# Patient Record
Sex: Female | Born: 1945
Health system: Southern US, Community
[De-identification: ages and names within clinical notes are randomized; demographics above are authoritative.]

## PROBLEM LIST (undated history)

## (undated) DIAGNOSIS — E039 Hypothyroidism, unspecified: Secondary | ICD-10-CM

## (undated) DIAGNOSIS — R413 Other amnesia: Secondary | ICD-10-CM

## (undated) DIAGNOSIS — I10 Essential (primary) hypertension: Secondary | ICD-10-CM

## (undated) DIAGNOSIS — J309 Allergic rhinitis, unspecified: Secondary | ICD-10-CM

## (undated) HISTORY — PX: CATARACT EXTRACTION: SUR2

## (undated) HISTORY — DX: Allergic rhinitis, unspecified: J30.9

## (undated) HISTORY — PX: ECTOPIC PREGNANCY SURGERY: SHX613

## (undated) HISTORY — DX: Essential (primary) hypertension: I10

## (undated) HISTORY — DX: Hypothyroidism, unspecified: E03.9

---

## 2013-07-31 DIAGNOSIS — H6120 Impacted cerumen, unspecified ear: Secondary | ICD-10-CM | POA: Diagnosis not present

## 2013-07-31 DIAGNOSIS — H60399 Other infective otitis externa, unspecified ear: Secondary | ICD-10-CM | POA: Diagnosis not present

## 2013-07-31 DIAGNOSIS — J309 Allergic rhinitis, unspecified: Secondary | ICD-10-CM | POA: Diagnosis not present

## 2013-09-18 DIAGNOSIS — L659 Nonscarring hair loss, unspecified: Secondary | ICD-10-CM | POA: Diagnosis not present

## 2013-09-18 DIAGNOSIS — E039 Hypothyroidism, unspecified: Secondary | ICD-10-CM | POA: Diagnosis not present

## 2013-11-05 DIAGNOSIS — E039 Hypothyroidism, unspecified: Secondary | ICD-10-CM | POA: Diagnosis not present

## 2014-02-11 DIAGNOSIS — E039 Hypothyroidism, unspecified: Secondary | ICD-10-CM | POA: Diagnosis not present

## 2014-08-15 DIAGNOSIS — B029 Zoster without complications: Secondary | ICD-10-CM | POA: Diagnosis not present

## 2014-08-15 DIAGNOSIS — E039 Hypothyroidism, unspecified: Secondary | ICD-10-CM | POA: Diagnosis not present

## 2015-09-01 DIAGNOSIS — L659 Nonscarring hair loss, unspecified: Secondary | ICD-10-CM | POA: Diagnosis not present

## 2015-09-01 DIAGNOSIS — M67431 Ganglion, right wrist: Secondary | ICD-10-CM | POA: Diagnosis not present

## 2015-09-01 DIAGNOSIS — D229 Melanocytic nevi, unspecified: Secondary | ICD-10-CM | POA: Diagnosis not present

## 2015-09-01 DIAGNOSIS — E039 Hypothyroidism, unspecified: Secondary | ICD-10-CM | POA: Diagnosis not present

## 2015-09-10 DIAGNOSIS — X32XXXA Exposure to sunlight, initial encounter: Secondary | ICD-10-CM | POA: Diagnosis not present

## 2015-09-10 DIAGNOSIS — L820 Inflamed seborrheic keratosis: Secondary | ICD-10-CM | POA: Diagnosis not present

## 2015-09-10 DIAGNOSIS — L570 Actinic keratosis: Secondary | ICD-10-CM | POA: Diagnosis not present

## 2016-09-08 DIAGNOSIS — E559 Vitamin D deficiency, unspecified: Secondary | ICD-10-CM | POA: Diagnosis not present

## 2016-09-08 DIAGNOSIS — Z0000 Encounter for general adult medical examination without abnormal findings: Secondary | ICD-10-CM | POA: Diagnosis not present

## 2016-09-08 DIAGNOSIS — Z136 Encounter for screening for cardiovascular disorders: Secondary | ICD-10-CM | POA: Diagnosis not present

## 2016-09-08 DIAGNOSIS — E039 Hypothyroidism, unspecified: Secondary | ICD-10-CM | POA: Diagnosis not present

## 2016-09-08 DIAGNOSIS — Z131 Encounter for screening for diabetes mellitus: Secondary | ICD-10-CM | POA: Diagnosis not present

## 2017-01-19 DIAGNOSIS — L820 Inflamed seborrheic keratosis: Secondary | ICD-10-CM | POA: Diagnosis not present

## 2017-11-22 DIAGNOSIS — Z1211 Encounter for screening for malignant neoplasm of colon: Secondary | ICD-10-CM | POA: Diagnosis not present

## 2017-11-22 DIAGNOSIS — R030 Elevated blood-pressure reading, without diagnosis of hypertension: Secondary | ICD-10-CM | POA: Diagnosis not present

## 2017-11-22 DIAGNOSIS — E559 Vitamin D deficiency, unspecified: Secondary | ICD-10-CM | POA: Diagnosis not present

## 2017-11-22 DIAGNOSIS — E039 Hypothyroidism, unspecified: Secondary | ICD-10-CM | POA: Diagnosis not present

## 2018-04-22 DIAGNOSIS — H6123 Impacted cerumen, bilateral: Secondary | ICD-10-CM | POA: Diagnosis not present

## 2018-04-25 DIAGNOSIS — H6122 Impacted cerumen, left ear: Secondary | ICD-10-CM | POA: Diagnosis not present

## 2018-12-07 DIAGNOSIS — E663 Overweight: Secondary | ICD-10-CM | POA: Diagnosis not present

## 2018-12-07 DIAGNOSIS — Z6826 Body mass index (BMI) 26.0-26.9, adult: Secondary | ICD-10-CM | POA: Diagnosis not present

## 2018-12-07 DIAGNOSIS — Z1159 Encounter for screening for other viral diseases: Secondary | ICD-10-CM | POA: Diagnosis not present

## 2018-12-07 DIAGNOSIS — E039 Hypothyroidism, unspecified: Secondary | ICD-10-CM | POA: Diagnosis not present

## 2018-12-07 DIAGNOSIS — E559 Vitamin D deficiency, unspecified: Secondary | ICD-10-CM | POA: Diagnosis not present

## 2019-06-08 DIAGNOSIS — H8110 Benign paroxysmal vertigo, unspecified ear: Secondary | ICD-10-CM | POA: Diagnosis not present

## 2019-08-28 DIAGNOSIS — Z23 Encounter for immunization: Secondary | ICD-10-CM | POA: Diagnosis not present

## 2019-11-29 ENCOUNTER — Ambulatory Visit: Payer: Medicare Other | Attending: Internal Medicine

## 2019-11-29 DIAGNOSIS — Z20822 Contact with and (suspected) exposure to covid-19: Secondary | ICD-10-CM

## 2019-11-30 LAB — NOVEL CORONAVIRUS, NAA: SARS-CoV-2, NAA: NOT DETECTED

## 2019-12-07 ENCOUNTER — Encounter (HOSPITAL_COMMUNITY): Payer: Self-pay | Admitting: Emergency Medicine

## 2019-12-07 ENCOUNTER — Other Ambulatory Visit: Payer: Self-pay

## 2019-12-07 ENCOUNTER — Emergency Department (HOSPITAL_COMMUNITY)
Admission: EM | Admit: 2019-12-07 | Discharge: 2019-12-07 | Disposition: A | Discharge status: Home / Self Care | Payer: Medicare Other | Attending: Emergency Medicine | Admitting: Emergency Medicine

## 2019-12-07 DIAGNOSIS — E039 Hypothyroidism, unspecified: Secondary | ICD-10-CM | POA: Insufficient documentation

## 2019-12-07 DIAGNOSIS — Z79899 Other long term (current) drug therapy: Secondary | ICD-10-CM | POA: Insufficient documentation

## 2019-12-07 DIAGNOSIS — Z2914 Encounter for prophylactic rabies immune globin: Secondary | ICD-10-CM | POA: Insufficient documentation

## 2019-12-07 DIAGNOSIS — Z203 Contact with and (suspected) exposure to rabies: Secondary | ICD-10-CM | POA: Diagnosis not present

## 2019-12-07 DIAGNOSIS — Z23 Encounter for immunization: Secondary | ICD-10-CM | POA: Diagnosis not present

## 2019-12-07 DIAGNOSIS — Z209 Contact with and (suspected) exposure to unspecified communicable disease: Secondary | ICD-10-CM

## 2019-12-07 DIAGNOSIS — I10 Essential (primary) hypertension: Secondary | ICD-10-CM | POA: Insufficient documentation

## 2019-12-07 MED ORDER — RABIES IMMUNE GLOBULIN 150 UNIT/ML IM INJ
20.0000 [IU]/kg | INJECTION | Freq: Once | INTRAMUSCULAR | Status: AC
  Administered 2019-12-07: 1200 [IU] via INTRAMUSCULAR
  Filled 2019-12-07: qty 8

## 2019-12-07 MED ORDER — RABIES VACCINE, PCEC IM SUSR
1.0000 mL | Freq: Once | INTRAMUSCULAR | Status: AC
  Administered 2019-12-07: 1 mL via INTRAMUSCULAR
  Filled 2019-12-07: qty 1

## 2019-12-07 NOTE — ED Provider Notes (Signed)
[facility] EMERGENCY DEPARTMENT Provider Note   CSN: [id number] Arrival date & time: 12/07/19  1214     History No chief complaint on file.   Valerie Reese is a 74 y.o. female presents to the ER for evaluation of bat exposure.  Patient states she returned home from a walk this morning and her dog started barking at the hat rack she had inside her house.  Patient was moving the hats from the hat rack to see what the dog was barking and a bat fell out of the hat onto the floor.  Patient states that the bat did not fly away or move but only called slightly, was crawling.  Patient used a wire basket to capture the bat and then took it outside.  She called her PCP who advised her to get the rabies vaccine due to possible exposure and unknown time the bat was in her home.  Patient denies coming directly in contact with the animal.  She denies any known bite marks or scratches.  Has never received rabies vaccine before.  No other concerns today.  HPI     Past Medical History:  Diagnosis Date  . Hypothyroid    age 61    There are no problems to display for this patient.   History reviewed. No pertinent surgical history.   OB History   No obstetric history on file.     Family History  Problem Relation Age of Onset  . Aneurysm Mother   . Fibroids Father   . Pulmonary disease Father   . Hypertension Father   . Hypertension Brother     Social History   Tobacco Use  . Smoking status: Former Research scientist (life sciences)  . Smokeless tobacco: Never Used  Substance Use Topics  . Alcohol use: Not on file  . Drug use: Not on file    Home Medications Prior to Admission medications   Medication Sig Start Date End Date Taking? Authorizing Provider  Cholecalciferol (VITAMIN D) 125 MCG (5000 UT) CAPS Take by mouth.    [provider]  Coenzyme Q10 (CO Q 10 PO) Take by mouth.    [provider]  levothyroxine (SYNTHROID) 112 MCG tablet Take 112 mcg by mouth daily  before breakfast.    [provider]  meclizine (ANTIVERT) 25 MG tablet Take 25 mg by mouth 3 (three) times daily as needed for dizziness.    [provider]  Multiple Vitamins-Minerals (EMERGEN-C IMMUNE PO) Take by mouth.    [provider]  TURMERIC PO Take by mouth.    [provider]    Allergies    Patient has no known allergies.  Review of Systems   Review of Systems  All other systems reviewed and are negative.   Physical Exam Updated Vital Signs BP (!) 189/109 (BP Location: Right Arm)   Pulse (!) 103   Temp 98.2 F (36.8 C) (Oral)   Resp 17   Wt 59.9 kg Comment: patient reported  SpO2 99%   Physical Exam Constitutional:      Appearance: She is well-developed.  HENT:     Head: Normocephalic.     Nose: Nose normal.  Eyes:     General: Lids are normal.  Cardiovascular:     Rate and Rhythm: Normal rate.  Pulmonary:     Effort: Pulmonary effort is normal. No respiratory distress.  Musculoskeletal:        General: Normal range of motion.     Cervical  back: Normal range of motion.  Neurological:     Mental Status: She is alert.  Psychiatric:        Behavior: Behavior normal.     ED Results / Procedures / Treatments   Labs (all labs ordered are listed, but only abnormal results are displayed) Labs Reviewed - No data to display  EKG None  Radiology No results found.  Procedures Procedures (including critical care time)  Medications Ordered in ED Medications  rabies vaccine (RABAVERT) injection 1 mL (1 mL Intramuscular Given 12/07/19 1350)  rabies immune globulin (HYPERAB/KEDRAB) injection 1,200 Units (1,200 Units Intramuscular Given 12/07/19 1352)    ED Course  I have reviewed the triage vital signs and the nursing notes.  Pertinent labs & imaging results that were available during my care of the patient were reviewed by me and considered in my medical decision making (see chart for details).    MDM  Rules/Calculators/A&P                      Discussed risk of rabies, risks and benefits of rabies vaccine.  Is unclear how long the bat has been in her house.  However she denies any direct known contact or bite marks.  Low threshold to start rabies vaccine.  Patient is agreeable.  Rabies immunoglobulin and vaccine given here.  She was educated on subsequent needed rabies vaccines.  She has her COVID-19 vaccine scheduled on 2/8.  Per CDC recommendations she is to wait 14 days after the last rabies vaccine.  Pharmacy discussed this with patient.  Instructed to follow-up as needed for subsequent rabies vaccines.  Return precautions discussed.  She is comfortable with the plan.   Final Clinical Impression(s) / ED Diagnoses Final diagnoses:  Exposure to bat without known bite    Rx / DC Orders ED Discharge Orders    None       Arlean Hopping 12/07/19 1553    Dorie Rank, MD 12/08/19 [id number]

## 2019-12-07 NOTE — ED Notes (Signed)
Patient states she never touched the bat however did touch the hats on the hatrack and the bowl she took the bat out of the house in.

## 2019-12-07 NOTE — Discharge Instructions (Addendum)
You were seen in the ER for bat exposure.  Rabies immunoglobulin and rabies vaccine were both administered today.  Today counts is day 0.  You need 3 more rabies vaccines on day 3, 7 and 14.  Day 0, 12/06/2018: rabies immune globulin (RIG) and rabies vaccine administered  Day 3, 12/09/2018: rabies vaccine needed Day 7, 12/13/2018: rabies vaccine needed Day 14, 12/20/2018: rabies vaccine needed   CDC recommends a 14 day wait period after last dose of rabies vaccine before you get your COVID vaccine

## 2019-12-07 NOTE — ED Triage Notes (Signed)
Pt here from home ,told to come to the ED for a rabies shot due to having a bat in her home , she did not touch the animal

## 2019-12-10 ENCOUNTER — Encounter (HOSPITAL_COMMUNITY): Payer: Self-pay

## 2019-12-10 ENCOUNTER — Ambulatory Visit (HOSPITAL_COMMUNITY)
Admission: EM | Admit: 2019-12-10 | Discharge: 2019-12-10 | Disposition: A | Discharge status: Home / Self Care | Payer: Medicare Other | Attending: Internal Medicine | Admitting: Internal Medicine

## 2019-12-10 DIAGNOSIS — Z23 Encounter for immunization: Secondary | ICD-10-CM

## 2019-12-10 DIAGNOSIS — Z203 Contact with and (suspected) exposure to rabies: Secondary | ICD-10-CM | POA: Diagnosis not present

## 2019-12-10 MED ORDER — RABIES VACCINE, PCEC IM SUSR
1.0000 mL | Freq: Once | INTRAMUSCULAR | Status: AC
  Administered 2019-12-10: 10:00:00 1 mL via INTRAMUSCULAR

## 2019-12-10 MED ORDER — RABIES VACCINE, PCEC IM SUSR
INTRAMUSCULAR | Status: AC
  Filled 2019-12-10: qty 1

## 2019-12-10 NOTE — ED Triage Notes (Signed)
Pt here for Day 3 of rabies vaccine series. Given in right deltoid.

## 2019-12-14 ENCOUNTER — Ambulatory Visit (HOSPITAL_COMMUNITY)
Admission: EM | Admit: 2019-12-14 | Discharge: 2019-12-14 | Disposition: A | Discharge status: Home / Self Care | Payer: Medicare Other | Attending: Emergency Medicine | Admitting: Emergency Medicine

## 2019-12-14 ENCOUNTER — Other Ambulatory Visit: Payer: Self-pay

## 2019-12-14 ENCOUNTER — Encounter (HOSPITAL_COMMUNITY): Payer: Self-pay

## 2019-12-14 DIAGNOSIS — Z203 Contact with and (suspected) exposure to rabies: Secondary | ICD-10-CM

## 2019-12-14 MED ORDER — RABIES VACCINE, PCEC IM SUSR
1.0000 mL | Freq: Once | INTRAMUSCULAR | Status: AC
  Administered 2019-12-14: 09:00:00 1 mL via INTRAMUSCULAR

## 2019-12-14 MED ORDER — RABIES VACCINE, PCEC IM SUSR
INTRAMUSCULAR | Status: AC
  Filled 2019-12-14: qty 1

## 2019-12-14 NOTE — ED Triage Notes (Signed)
Pt here for day 7 of rabies series, given in left deltoid.

## 2019-12-17 ENCOUNTER — Encounter: Payer: Self-pay | Admitting: Internal Medicine

## 2019-12-17 ENCOUNTER — Other Ambulatory Visit: Payer: Self-pay

## 2019-12-17 ENCOUNTER — Encounter: Payer: Self-pay | Admitting: *Deleted

## 2019-12-17 ENCOUNTER — Ambulatory Visit: Payer: Medicare Other | Admitting: Internal Medicine

## 2019-12-17 VITALS — BP 182/95 | HR 69 | Temp 97.2°F | Ht 62.0 in | Wt 140.4 lb

## 2019-12-17 DIAGNOSIS — E039 Hypothyroidism, unspecified: Secondary | ICD-10-CM | POA: Diagnosis not present

## 2019-12-17 DIAGNOSIS — R030 Elevated blood-pressure reading, without diagnosis of hypertension: Secondary | ICD-10-CM | POA: Diagnosis not present

## 2019-12-17 DIAGNOSIS — R002 Palpitations: Secondary | ICD-10-CM

## 2019-12-17 NOTE — Patient Instructions (Signed)
Medication Instructions:  No changes *If you need a refill on your cardiac medications before your next appointment, please call your pharmacy*  Lab Work: Not needed   Testing/Procedures: Will be schedule at [facility] has requested that you have an echocardiogram. Echocardiography is a painless test that uses sound waves to create images of your heart. It provides your doctor with information about the size and shape of your heart and how well your heart's chambers and valves are working. This procedure takes approximately one hour. There are no restrictions for this procedure.  And Your physician has recommended that you wear an event monitor 2 weeks . Event monitors are medical devices that record the heart's electrical activity. Doctors most often [place] these monitors to diagnose arrhythmias. Arrhythmias are problems with the speed or rhythm of the heartbeat. The monitor is a small, portable device. You can wear one while you do your normal daily activities. This is usually used to diagnose what is causing palpitations/syncope (passing out).   Follow-Up: At [facility], you and your health needs are our priority.  As part of our continuing mission to provide you with exceptional heart care, we have created designated Provider Care Teams.  These Care Teams include your primary Cardiologist (physician) and Advanced Practice Providers (APPs -  Physician Assistants and Nurse Practitioners) who all work together to provide you with the care you need, when you need it.  Your next appointment:   6 week(s)  The format for your next appointment:   In Person  Provider:   Cherlynn Kaiser, MD  Other Instructions N/a   [employer] Cardiac Event Monitor Instructions Your physician has requested you wear your cardiac event monitor for __14___ days, (1-30). [employer] may call or text to confirm a shipping address. The monitor will be sent to a land address  via UPS. [employer] will not ship a monitor to a PO BOX. It typically takes 3-5 days to receive your monitor after it has been enrolled. [employer] will assist with USPS tracking if your package is delayed. The telephone number for [employer] is [phone]. Once you have received your monitor, please review the enclosed instructions. Instruction tutorials can also be viewed under help and settings on the enclosed cell phone. Your monitor has already been registered assigning a specific monitor serial # to you.  Applying the monitor Remove cell phone from case and turn it on. The cell phone works as Dealer and needs to be within [employer] of you at all times. The cell phone will need to be charged on a daily basis. We recommend you plug the cell phone into the enclosed charger at your bedside table every night.  Monitor batteries: You will receive two monitor batteries labelled #1 and #2. These are your recorders. Plug battery #2 onto the second connection on the enclosed charger. Keep one battery on the charger at all times. This will keep the monitor battery deactivated. It will also keep it fully charged for when you need to switch your monitor batteries. A small light will be blinking on the battery emblem when it is charging. The light on the battery emblem will remain on when the battery is fully charged.  Open package of a Monitor strip. Insert battery #1 into black hood on strip and gently squeeze monitor battery onto connection as indicated in instruction booklet. Set aside while preparing skin.  Choose location for your strip, vertical or horizontal, as indicated in the instruction  booklet. Shave to remove all hair from location. There cannot be any lotions, oils, powders, or colognes on skin where monitor is to be applied. Wipe skin clean with enclosed Saline wipe. Dry skin completely.  Peel paper labeled #1 off the back of the Monitor strip exposing the adhesive.  Place the monitor on the chest in the vertical or horizontal position shown in the instruction booklet. One arrow on the monitor strip must be pointing upward. Carefully remove paper labeled #2, attaching remainder of strip to your skin. Try not to create any folds or wrinkles in the strip as you apply it.  Firmly press and release the circle in the center of the monitor battery. You will hear a small beep. This is turning the monitor battery on. The heart emblem on the monitor battery will light up every 5 seconds if the monitor battery in turned on and connected to the patient securely. Do not push and hold the circle down as this turns the monitor battery off. The cell phone will locate the monitor battery. A screen will appear on the cell phone checking the connection of your monitor strip. This may read poor connection initially but change to good connection within the next minute. Once your monitor accepts the connection you will hear a series of 3 beeps followed by a climbing crescendo of beeps. A screen will appear on the cell phone showing the two monitor strip placement options. Touch the picture that demonstrates where you applied the monitor strip.  Your monitor strip and battery are waterproof. You are able to shower, bathe, or swim with the monitor on. They just ask you do not submerge deeper than 3 feet underwater. We recommend removing the monitor if you are swimming in a lake, river, or ocean.  Your monitor battery will need to be switched to a fully charged monitor battery approximately once a week. The cell phone will alert you of an action which needs to be made.  On the cell phone, tap for details to reveal connection status, monitor battery status, and cell phone battery status. The green dots indicates your monitor is in good status. A red dot indicates there is something that needs your attention.  To record a symptom, click the circle on the monitor battery. In 30-60  seconds a list of symptoms will appear on the cell phone. Select your symptom and tap save. Your monitor will record a sustained or significant arrhythmia regardless of you clicking the button. Some patients do not feel the heart rhythm irregularities. [employer] will notify us of any serious or critical events.  Refer to instruction booklet for instructions on switching batteries, changing strips, the Do not disturb or Pause features, or any additional questions.  Call [employer] at [phone], to confirm your monitor is transmitting and record your baseline. They will answer any questions you may have regarding the monitor instructions at that time.  Returning the monitor to [place] all equipment back into blue box. Peel off strip of paper to expose adhesive and close box securely. There is a prepaid UPS shipping label on this box. Drop in a UPS drop box, or at a UPS facility like [employer]. You may also contact [employer] to arrange UPS to pick up monitor package at your home.

## 2019-12-17 NOTE — Progress Notes (Signed)
Patient ID: Valerie Reese, female   DOB: 07/07/1946, 74 y.o.   MRN: [id number] Patient enrolled for [employer] to ship a 14 day cardiac event monitor to her home.

## 2019-12-17 NOTE — Progress Notes (Signed)
Cardiology Office Note:    Date:  12/17/2019   ID:  Valerie Reese, DOB 1946-07-07, MRN [id number]  PCP:  Kathyrn Lass, MD  Cardiologist:  No primary care provider on file.  Electrophysiologist:  None   Referring MD: Kathyrn Lass, MD   Chief Complaint: palpitations  History of Present Illness:    Valerie Reese is a 74 y.o. female with a hx of hypothyroidism who presents today for palpitations, with concern to rule out atrial fibrillation from PCP.   She is recently retired and feeling stressed given that she is unable to see or talk to anyone given the COVID-19 pandemic.  She noticed that approximately 2 times a day for the past several weeks she has had noticeable heartbeats that seem fast and regular, in early December she transition to decaf coffee.  Does not take decongestants.  No chest pain, no shortness of breath.  Episodes last 5 to 10 minutes and then stop.  Patient takes levothyroxine for thyroid supplementation.  Retired recently. Anxious because of isolation. Heart beating fast, no pain. Off and on. Yesterday bothered her due to anxiety. Happens 1x a week, a few times a month.   Parents no heart disease, uncle died in 60s of MI.   Prior smoker quit in her 31s, smoked for 15 years, 1ppd.   Recent labs TSH 0.77 (normal), creatinine 0.74, potassium 5.1, sodium 137, hemoglobin 13.3, platelet count 305,000, WBCs 6.6.  Caffeine: Now using decaf Alcohol: several times a week beer Water intake: dehydrated Snoring: no TSH: Normal in December Herbal supplements/diet products: Turmeric - through [employer].  Syncope/presyncope: no, but dizziness  Past Medical History:  Diagnosis Date  . Hypothyroid    age 5    No past surgical history on file.  Current Medications: Current Meds  Medication Sig  . Cholecalciferol (VITAMIN D) 125 MCG (5000 UT) CAPS Take by mouth.  . Coenzyme Q10 (CO Q 10 PO) Take by mouth.  . levothyroxine (SYNTHROID) 112 MCG tablet Take 112 mcg by  mouth daily before breakfast.  . Multiple Vitamins-Minerals (EMERGEN-C IMMUNE PO) Take by mouth.  . TURMERIC PO Take by mouth.     Allergies:   Patient has no known allergies.   Social History   Socioeconomic History  . Marital status: Single    Spouse name: Not on file  . Number of children: Not on file  . Years of education: Not on file  . Highest education level: Not on file  Occupational History  . Not on file  Tobacco Use  . Smoking status: Former Research scientist (life sciences)  . Smokeless tobacco: Never Used  Substance and Sexual Activity  . Alcohol use: Not on file  . Drug use: Not on file  . Sexual activity: Not on file  Other Topics Concern  . Not on file  Social History Narrative  . Not on file   Social Determinants of Health   Financial Resource Strain:   . Difficulty of Paying Living Expenses: Not on file  Food Insecurity:   . Worried About Charity fundraiser in the Last Year: Not on file  . Ran Out of Food in the Last Year: Not on file  Transportation Needs:   . Lack of Transportation (Medical): Not on file  . Lack of Transportation (Non-Medical): Not on file  Physical Activity:   . Days of Exercise per Week: Not on file  . Minutes of Exercise per Session: Not on file  Stress:   . Feeling of Stress :  Not on file  Social Connections:   . Frequency of Communication with Friends and Family: Not on file  . Frequency of Social Gatherings with Friends and Family: Not on file  . Attends Religious Services: Not on file  . Active Member of Clubs or Organizations: Not on file  . Attends Archivist Meetings: Not on file  . Marital Status: Not on file     Family History: The patient's family history includes Aneurysm in her mother; Fibroids in her father; Hypertension in her brother and father; Pulmonary disease in her father.  ROS:   Please see the history of present illness.    All other systems reviewed and are negative.  EKGs/Labs/Other Studies Reviewed:    The  following studies were reviewed today:  EKG:  NSR,sinus arrhythmia, rate 69.  Recent Labs: No results found for requested labs within last 8760 hours.  Recent Lipid Panel No results found for: CHOL, TRIG, HDL, CHOLHDL, VLDL, LDLCALC, LDLDIRECT  Physical Exam:    VS:  BP (!) 182/95   Pulse 69   Temp (!) 97.2 F (36.2 C)   Ht 5\' 2"  (1.575 m)   Wt 140 lb 6.4 oz (63.7 kg)   SpO2 100%   BMI 25.68 kg/m   180/98, at home 130s/70s  Wt Readings from Last 5 Encounters:  12/17/19 140 lb 6.4 oz (63.7 kg)  12/07/19 132 lb (59.9 kg)     Constitutional: No acute distress Eyes: sclera non-icteric, normal conjunctiva and lids ENMT: normal dentition, moist mucous membranes Cardiovascular: regular rhythm, normal rate, no murmurs. S1 and S2 normal. Radial pulses normal bilaterally. No jugular venous distention.  Respiratory: clear to auscultation bilaterally GI : normal bowel sounds, soft and nontender. No distention.   MSK: extremities warm, well perfused. No edema.  NEURO: grossly nonfocal exam, moves all extremities. PSYCH: alert and oriented x 3, normal mood and affect.   ASSESSMENT:    1. Palpitations   2. Elevated blood pressure reading   3. Hypothyroidism, unspecified type    PLAN:    Palpitations -she will need an evaluation for palpitations including an event monitor to exclude atrial fibrillation.  In addition we should obtain an echocardiogram to rule out structural heart disease as a contributor.  Elevated blood pressure reading-in office and ER blood pressures significantly elevated, however she tells me that her blood pressures are usually 130s over 70s at home.  We will monitor this again in the office next week, if still elevated, indications to treat whitecoat hypertension given its concern for underlying hypertensive disorder.  Hypothyroidism-per PCP, last TSH normal, hopeful that it is not a contributor to palpitations.  Dizziness-1 episode, prescribed meclizine which  she has not taken.  Not in conjunction with palpitations.  Continue to observe.  Total time of encounter: 45 minutes total time of encounter, including 25 minutes spent in face-to-face patient care. This time includes coordination of care and counseling regarding above mentioned issues. Remainder of non-face-to-face time involved reviewing chart documents/testing relevant to the patient encounter and documentation in the medical record. Approximately 20 pages of outside records reviewed in conjunction with this encounter.   Cherlynn Kaiser, MD [place]  [facility]   Medication Adjustments/Labs and Tests Ordered: Current medicines are reviewed at length with the patient today.  Concerns regarding medicines are outlined above.  Orders Placed This Encounter  Procedures  . CARDIAC EVENT MONITOR  . EKG 12-Lead  . ECHOCARDIOGRAM COMPLETE   No orders of the defined types  were placed in this encounter.   Patient Instructions  Medication Instructions:  No changes *If you need a refill on your cardiac medications before your next appointment, please call your pharmacy*  Lab Work: Not needed   Testing/Procedures: Will be schedule at [place] has requested that you have an echocardiogram. Echocardiography is a painless test that uses sound waves to create images of your heart. It provides your doctor with information about the size and shape of your heart and how well your heart's chambers and valves are working. This procedure takes approximately one hour. There are no restrictions for this procedure.  And Your physician has recommended that you wear an event monitor 2 weeks . Event monitors are medical devices that record the heart's electrical activity. Doctors most often [place] these monitors to diagnose arrhythmias. Arrhythmias are problems with the speed or rhythm of the heartbeat. The monitor is a small, portable device. You can wear one while  you do your normal daily activities. This is usually used to diagnose what is causing palpitations/syncope (passing out).   Follow-Up: At [facility], you and your health needs are our priority.  As part of our continuing mission to provide you with exceptional heart care, we have created designated Provider Care Teams.  These Care Teams include your primary Cardiologist (physician) and Advanced Practice Providers (APPs -  Physician Assistants and Nurse Practitioners) who all work together to provide you with the care you need, when you need it.  Your next appointment:   6 week(s)  The format for your next appointment:   In Person  Provider:   Cherlynn Kaiser, MD  Other Instructions N/a   [employer] Cardiac Event Monitor Instructions Your physician has requested you wear your cardiac event monitor for __14___ days, (1-30). [employer] may call or text to confirm a shipping address. The monitor will be sent to a land address via UPS. [employer] will not ship a monitor to a PO BOX. It typically takes 3-5 days to receive your monitor after it has been enrolled. [employer] will assist with USPS tracking if your package is delayed. The telephone number for [employer] is [phone]. Once you have received your monitor, please review the enclosed instructions. Instruction tutorials can also be viewed under help and settings on the enclosed cell phone. Your monitor has already been registered assigning a specific monitor serial # to you.  Applying the monitor Remove cell phone from case and turn it on. The cell phone works as Dealer and needs to be within [employer] of you at all times. The cell phone will need to be charged on a daily basis. We recommend you plug the cell phone into the enclosed charger at your bedside table every night.  Monitor batteries: You will receive two monitor batteries labelled #1 and #2. These are your recorders. Plug battery #2 onto the second  connection on the enclosed charger. Keep one battery on the charger at all times. This will keep the monitor battery deactivated. It will also keep it fully charged for when you need to switch your monitor batteries. A small light will be blinking on the battery emblem when it is charging. The light on the battery emblem will remain on when the battery is fully charged.  Open package of a Monitor strip. Insert battery #1 into black hood on strip and gently squeeze monitor battery onto connection as indicated in instruction booklet. Set aside while preparing skin.  Choose location  for your strip, vertical or horizontal, as indicated in the instruction booklet. Shave to remove all hair from location. There cannot be any lotions, oils, powders, or colognes on skin where monitor is to be applied. Wipe skin clean with enclosed Saline wipe. Dry skin completely.  Peel paper labeled #1 off the back of the Monitor strip exposing the adhesive. Place the monitor on the chest in the vertical or horizontal position shown in the instruction booklet. One arrow on the monitor strip must be pointing upward. Carefully remove paper labeled #2, attaching remainder of strip to your skin. Try not to create any folds or wrinkles in the strip as you apply it.  Firmly press and release the circle in the center of the monitor battery. You will hear a small beep. This is turning the monitor battery on. The heart emblem on the monitor battery will light up every 5 seconds if the monitor battery in turned on and connected to the patient securely. Do not push and hold the circle down as this turns the monitor battery off. The cell phone will locate the monitor battery. A screen will appear on the cell phone checking the connection of your monitor strip. This may read poor connection initially but change to good connection within the next minute. Once your monitor accepts the connection you will hear a series of 3 beeps  followed by a climbing crescendo of beeps. A screen will appear on the cell phone showing the two monitor strip placement options. Touch the picture that demonstrates where you applied the monitor strip.  Your monitor strip and battery are waterproof. You are able to shower, bathe, or swim with the monitor on. They just ask you do not submerge deeper than 3 feet underwater. We recommend removing the monitor if you are swimming in a lake, river, or ocean.  Your monitor battery will need to be switched to a fully charged monitor battery approximately once a week. The cell phone will alert you of an action which needs to be made.  On the cell phone, tap for details to reveal connection status, monitor battery status, and cell phone battery status. The green dots indicates your monitor is in good status. A red dot indicates there is something that needs your attention.  To record a symptom, click the circle on the monitor battery. In 30-60 seconds a list of symptoms will appear on the cell phone. Select your symptom and tap save. Your monitor will record a sustained or significant arrhythmia regardless of you clicking the button. Some patients do not feel the heart rhythm irregularities. [employer] will notify us of any serious or critical events.  Refer to instruction booklet for instructions on switching batteries, changing strips, the Do not disturb or Pause features, or any additional questions.  Call [employer] at [phone], to confirm your monitor is transmitting and record your baseline. They will answer any questions you may have regarding the monitor instructions at that time.  Returning the monitor to [place] all equipment back into blue box. Peel off strip of paper to expose adhesive and close box securely. There is a prepaid UPS shipping label on this box. Drop in a UPS drop box, or at a UPS facility like [employer]. You may also contact [employer] to arrange UPS to  pick up monitor package at your home.

## 2019-12-21 ENCOUNTER — Other Ambulatory Visit: Payer: Self-pay

## 2019-12-21 ENCOUNTER — Encounter (HOSPITAL_COMMUNITY): Payer: Self-pay

## 2019-12-21 ENCOUNTER — Ambulatory Visit (HOSPITAL_COMMUNITY)
Admission: EM | Admit: 2019-12-21 | Discharge: 2019-12-21 | Disposition: A | Discharge status: Home / Self Care | Payer: Medicare Other | Attending: Family Medicine | Admitting: Family Medicine

## 2019-12-21 DIAGNOSIS — Z23 Encounter for immunization: Secondary | ICD-10-CM | POA: Diagnosis not present

## 2019-12-21 DIAGNOSIS — Z203 Contact with and (suspected) exposure to rabies: Secondary | ICD-10-CM

## 2019-12-21 MED ORDER — RABIES VACCINE, PCEC IM SUSR
INTRAMUSCULAR | Status: AC
  Filled 2019-12-21: qty 1

## 2019-12-21 MED ORDER — RABIES VACCINE, PCEC IM SUSR
1.0000 mL | Freq: Once | INTRAMUSCULAR | Status: AC
  Administered 2019-12-21: 1 mL via INTRAMUSCULAR

## 2019-12-21 NOTE — ED Triage Notes (Signed)
Pt here for day 14 (final) rabies injection, given in right deltoid.

## 2019-12-24 ENCOUNTER — Ambulatory Visit: Payer: Medicare Other

## 2019-12-26 ENCOUNTER — Ambulatory Visit (HOSPITAL_COMMUNITY): Payer: Medicare Other | Attending: Internal Medicine

## 2019-12-26 ENCOUNTER — Other Ambulatory Visit: Payer: Self-pay

## 2019-12-26 DIAGNOSIS — I77810 Thoracic aortic ectasia: Secondary | ICD-10-CM | POA: Diagnosis not present

## 2019-12-26 DIAGNOSIS — R002 Palpitations: Secondary | ICD-10-CM | POA: Insufficient documentation

## 2019-12-26 DIAGNOSIS — Z87891 Personal history of nicotine dependence: Secondary | ICD-10-CM | POA: Insufficient documentation

## 2019-12-26 DIAGNOSIS — I340 Nonrheumatic mitral (valve) insufficiency: Secondary | ICD-10-CM

## 2019-12-26 DIAGNOSIS — I77819 Aortic ectasia, unspecified site: Secondary | ICD-10-CM | POA: Diagnosis not present

## 2019-12-26 DIAGNOSIS — E039 Hypothyroidism, unspecified: Secondary | ICD-10-CM | POA: Insufficient documentation

## 2019-12-26 DIAGNOSIS — R030 Elevated blood-pressure reading, without diagnosis of hypertension: Secondary | ICD-10-CM | POA: Diagnosis not present

## 2019-12-27 ENCOUNTER — Telehealth: Payer: Self-pay | Admitting: Internal Medicine

## 2019-12-27 NOTE — Telephone Encounter (Signed)
A smaller profile monitor is appropriate, if she would like to switch. Please request that she not be billed for this one that she does not intend to wear. Please let me know if she has other questions.

## 2019-12-27 NOTE — Telephone Encounter (Signed)
Pt called to report that she received her monitor and it is an event monitor much larger and more than involved than what she was expecting... she says she was under the impression she was having a "patch" that was less involved than the phone she has to have and all of the equipment required with the event monitor.  I explained the difference in the monitors with her and how the event monitor will offer her constant monitoring but she is not willing to use this size monitor for the 14 days.   I advised her that I will forward to Dr. Margaretann Loveless about switching it to another option lesser such as the Zio patch.   I advised her to hang on to the event monitor until our monitor nurses talk with her about it.   She is very concerned about the billing aspect and the cost to her. I will forward to Shelly/Katy to help with the needed information the pt is asking for and help with another monitor possibly.

## 2019-12-27 NOTE — Telephone Encounter (Signed)
Patient calling with questions on her heart monitor.

## 2019-12-28 NOTE — Telephone Encounter (Signed)
Unable to reach patient. Left HIPPA compliant voicemail for her to return my call to discuss montior

## 2019-12-28 NOTE — Telephone Encounter (Signed)
Unable to reach patient phone goes directly to voicemail.

## 2019-12-28 NOTE — Telephone Encounter (Signed)
Patient called this morning to follow up and get instructions as to what to do with her current heart monitor. She was under the impression someone would call her yesterday but she did not receive a call.

## 2020-01-01 NOTE — Telephone Encounter (Signed)
Unable to reach patient phone goes directly to voicemail. Asked patient to call back.

## 2020-01-01 NOTE — Telephone Encounter (Signed)
Unable to reach patient.

## 2020-01-04 ENCOUNTER — Ambulatory Visit: Payer: Medicare Other

## 2020-01-06 ENCOUNTER — Ambulatory Visit: Payer: Medicare Other | Attending: Internal Medicine

## 2020-01-06 DIAGNOSIS — Z23 Encounter for immunization: Secondary | ICD-10-CM

## 2020-01-06 NOTE — Progress Notes (Signed)
   Covid-19 Vaccination Clinic  Name:  Valerie Reese    MRN: [id number] DOB: [birth year]  01/06/2020  Valerie Reese was observed post Covid-19 immunization for 15 minutes without incidence. She was provided with Vaccine Information Sheet and instruction to access the V-Safe system.   Valerie Reese was instructed to call 911 with any severe reactions post vaccine: Marland Kitchen Difficulty breathing  . Swelling of your face and throat  . A fast heartbeat  . A bad rash all over your body  . Dizziness and weakness    Immunizations Administered    Name Date Dose VIS Date Route   Pfizer COVID-19 Vaccine 01/06/2020  9:18 AM 0.3 mL 10/26/2019 Intramuscular   Manufacturer: [place]   Lot: [id number]   [place]: [id number]

## 2020-01-28 ENCOUNTER — Encounter: Payer: Self-pay | Admitting: *Deleted

## 2020-01-28 ENCOUNTER — Encounter: Payer: Self-pay | Admitting: Internal Medicine

## 2020-01-28 ENCOUNTER — Telehealth: Payer: Self-pay | Admitting: *Deleted

## 2020-01-28 ENCOUNTER — Other Ambulatory Visit: Payer: Self-pay

## 2020-01-28 ENCOUNTER — Ambulatory Visit: Payer: Medicare Other | Admitting: Internal Medicine

## 2020-01-28 VITALS — BP 150/90 | HR 81 | Temp 97.0°F | Ht 63.0 in | Wt 137.4 lb

## 2020-01-28 DIAGNOSIS — E039 Hypothyroidism, unspecified: Secondary | ICD-10-CM | POA: Diagnosis not present

## 2020-01-28 DIAGNOSIS — I77810 Thoracic aortic ectasia: Secondary | ICD-10-CM

## 2020-01-28 DIAGNOSIS — R030 Elevated blood-pressure reading, without diagnosis of hypertension: Secondary | ICD-10-CM | POA: Diagnosis not present

## 2020-01-28 DIAGNOSIS — R002 Palpitations: Secondary | ICD-10-CM

## 2020-01-28 MED ORDER — LOSARTAN POTASSIUM 25 MG PO TABS: 25.0000 mg | ORAL_TABLET | Freq: Every day | ORAL | 3 refills | Status: DC

## 2020-01-28 NOTE — Progress Notes (Signed)
Cardiology Office Note:    Date:  01/28/2020   ID:  TAHMINA FRANGELLA, DOB 17-Jul-1946, MRN [id number]  PCP:  Kathyrn Lass, MD  Cardiologist:  No primary care provider on file.  Electrophysiologist:  None   Referring MD: Kathyrn Lass, MD   Chief Complaint: f/u palpitations  History of Present Illness:    Valerie Reese is a 74 y.o. female with a history of hypothyroidism who presents today for follow up of palpitations, with concern to rule out atrial fibrillation from PCP.   She expresses her frustration that the monitor prescribed was too large for her to wear, and she returned it without using. I had prescribed a smaller profile monitor if available, however she was unable to have this coordinated prior to our visit. We discussed this at length, and will attempt to come up with a suitable method for monitoring if she remains interested in assessment for atrial fibrillation.  We reviewed Echo results, possible dilated ascending aorta by echo. Discussed CTA aorta per guidelines.   Blood pressure elevated over serial visits, suggestive of hypertension. Discussed optimal therapy particularly if aorta is infact dilated.   Past Medical History:  Diagnosis Date   Hypothyroid    age 58    No past surgical history on file.  Current Medications: Current Meds  Medication Sig   Cholecalciferol (VITAMIN D) 125 MCG (5000 UT) CAPS Take by mouth.   Coenzyme Q10 (CO Q 10 PO) Take by mouth.   levothyroxine (SYNTHROID) 112 MCG tablet Take 112 mcg by mouth daily before breakfast.   Multiple Vitamins-Minerals (EMERGEN-C IMMUNE PO) Take by mouth.   TURMERIC PO Take by mouth.     Allergies:   Patient has no known allergies.   Social History   Socioeconomic History   Marital status: Single    Spouse name: Not on file   Number of children: Not on file   Years of education: Not on file   Highest education level: Not on file  Occupational History   Not on file  Tobacco Use     Smoking status: Former Smoker   Smokeless tobacco: Never Used  Substance and Sexual Activity   Alcohol use: Not on file   Drug use: Not on file   Sexual activity: Not on file  Other Topics Concern   Not on file  Social History Narrative   Not on file   Social Determinants of Health   Financial Resource Strain:    Difficulty of Paying Living Expenses:   Food Insecurity:    Worried About Charity fundraiser in the Last Year:    Arboriculturist in the Last Year:   Transportation Needs:    Film/video editor (Medical):    Lack of Transportation (Non-Medical):   Physical Activity:    Days of Exercise per Week:    Minutes of Exercise per Session:   Stress:    Feeling of Stress :   Social Connections:    Frequency of Communication with Friends and Family:    Frequency of Social Gatherings with Friends and Family:    Attends Religious Services:    Active Member of Clubs or Organizations:    Attends Music therapist:    Marital Status:      Family History: The patient's family history includes Aneurysm in her mother; Fibroids in her father; Hypertension in her brother and father; Pulmonary disease in her father.  ROS:   Please see the history of present  illness.    All other systems reviewed and are negative.  EKGs/Labs/Other Studies Reviewed:    The following studies were reviewed today:  EKG:  Not performed today  I have independently reviewed the images echocardiogram 12/17/19 .  Recent Labs: No results found for requested labs within last 8760 hours.  Recent Lipid Panel No results found for: CHOL, TRIG, HDL, CHOLHDL, VLDL, LDLCALC, LDLDIRECT  Physical Exam:    VS:  BP (!) 150/90    Pulse 81    Temp (!) 97 F (36.1 C)    Ht 5\' 3"  (1.6 m)    Wt 137 lb 6.4 oz (62.3 kg)    SpO2 99%    BMI 24.34 kg/m     Wt Readings from Last 5 Encounters:  01/28/20 137 lb 6.4 oz (62.3 kg)  12/17/19 140 lb 6.4 oz (63.7 kg)  12/07/19 132 lb  (59.9 kg)     Constitutional: No acute distress Eyes: sclera non-icteric, normal conjunctiva and lids ENMT: normal dentition, moist mucous membranes Cardiovascular: regular rhythm, normal rate, no murmurs. S1 and S2 normal. Radial pulses normal bilaterally. No jugular venous distention.  Respiratory: clear to auscultation bilaterally GI : normal bowel sounds, soft and nontender. No distention.   MSK: extremities warm, well perfused. No edema.  NEURO: grossly nonfocal exam, moves all extremities. PSYCH: alert and oriented x 3, normal mood and affect.   ASSESSMENT:    1. Palpitations   2. Ascending aorta dilatation (HCC)   3. Elevated blood pressure reading   4. Hypothyroidism, unspecified type    PLAN:    Palpitations - Ms. Desta would like to try once again for a monitor that may be more suitable for her. We will prescribe a monitor different from previous for assessment of atrial fibrillation. She continues to have palpiations.  Plan: LONG TERM MONITOR (3-14 DAYS)  Ascending aorta dilatation (HCC) - suggested by echo, will perform CTA aorta to evaluate further per guideline recommendations. Plan: CT ANGIO CHEST AORTA W/CM & OR WO/CM  Elevated blood pressure reading - suggestive of hypertension over serial visits, will initiate losartan 25 mg daily.   Hypothyroidism, unspecified type - per PCP.    Total time of encounter: 35 minutes total time of encounter, including 25 minutes spent in face-to-face patient care. This time includes coordination of care and counseling regarding above mentioned problem list. Remainder of non-face-to-face time involved reviewing chart documents/testing relevant to the patient encounter and documentation in the medical record. I have independently reviewed documentation from referring provider.   Cherlynn Kaiser, MD [place]   [facility]    Medication Adjustments/Labs and Tests Ordered: Current medicines are reviewed at length with the  patient today.  Concerns regarding medicines are outlined above.  Orders Placed This Encounter  Procedures   CT ANGIO CHEST AORTA W/CM & OR WO/CM   LONG TERM MONITOR (3-14 DAYS)   Meds ordered this encounter  Medications   losartan (COZAAR) 25 MG tablet    Sig: Take 1 tablet (25 mg total) by mouth daily.    Dispense:  90 tablet    Refill:  3    Patient Instructions  Medication Instructions:    start Losartan 25 mg one tablet daily   Continue with all other medications   *If you need a refill on your cardiac medications before your next appointment, please call your pharmacy*   Lab Work: Not needed    Testing/Procedures: WILL BE SCHEDULE AT [place]  Your physician has requested that you have cardiac CT. Cardiac computed tomography (CT) is a painless test that uses an x-ray machine to take clear, detailed pictures of your heart.     Please let office know if you would like to proceed with  [place] physician has recommended that you wear a 14  DAY ZIO-PATCH monitor. The Zio patch cardiac monitor continuously records heart rhythm data for up to 14 days, this is for patients being evaluated for multiple types heart rhythms. For the first 24 hours post application, please avoid getting the Zio monitor wet in the shower or by excessive sweating during exercise. After that, feel free to carry on with regular activities. Keep soaps and lotions away from the ZIO XT Patch.  This will be mailed to you, please expect 7-10 days to receive.  [employer] location - [place], [address].         Follow-Up: At [facility], you and your health needs are our priority.  As part of our continuing mission to provide you with exceptional heart care, we have created designated Provider Care Teams.  These Care Teams include your primary Cardiologist (physician) and Advanced Practice Providers (APPs -  Physician Assistants and Nurse Practitioners)  who all work together to provide you with the care you need, when you need it.  We recommend signing up for the patient portal called "MyChart".  Sign up information is provided on this After Visit Summary.  MyChart is used to connect with patients for Virtual Visits (Telemedicine).  Patients are able to view lab/test results, encounter notes, upcoming appointments, etc.  Non-urgent messages can be sent to your provider as well.   To learn more about what you can do with MyChart, go to [email or web address].    Your next appointment:   3 month(s)  The format for your next appointment:   Either In Person or Virtual  Provider:   Cherlynn Kaiser, MD   [place] Monitor Instructions   Your physician has requested you wear your ZIO patch monitor__14_____days.   This is a single patch monitor.  [employer] supplies one patch monitor per enrollment.  Additional stickers are not available.   Please do not apply patch if you will be having a Nuclear Stress Test, Echocardiogram, Cardiac CT, MRI, or Chest Xray during the time frame you would be wearing the monitor. The patch cannot be worn during these tests.  You cannot remove and re-apply the ZIO XT patch monitor.   Your ZIO patch monitor will be sent USPS Priority mail from [facility] directly to your home address. The monitor may also be mailed to a PO BOX if home delivery is not available.   It may take 3-5 days to receive your monitor after you have been enrolled.   Once you have received you monitor, please review enclosed instructions.  Your monitor has already been registered assigning a specific monitor serial # to you.   Applying the monitor   Shave hair from upper left chest.   Hold abrader disc by orange tab.  Rub abrader in 40 strokes over left upper chest as indicated in your monitor instructions.   Clean area with 4 enclosed alcohol pads .  Use all pads to assure are is cleaned thoroughly.  Let dry.   Apply  patch as indicated in monitor instructions.  Patch will be place under collarbone on left side of chest with arrow pointing upward.   Rub patch  adhesive wings for 2 minutes.Remove white label marked "1".  Remove white label marked "2".  Rub patch adhesive wings for 2 additional minutes.   While looking in a mirror, press and release button in center of patch.  A small green light will flash 3-4 times .  This will be your only indicator the monitor has been turned on.     Do not shower for the first 24 hours.  You may shower after the first 24 hours.   Press button if you feel a symptom. You will hear a small click.  Record Date, Time and Symptom in the Patient Log Book.   When you are ready to remove patch, follow instructions on last 2 pages of Patient Log Book.  Stick patch monitor onto last page of Patient Log Book.   Place Patient Log Book in [place] box.  Use locking tab on box and tape box closed securely.  The [employer] and [employer] has [employer] on it.  Please place in mailbox as soon as possible.  Your physician should have your test results approximately 7 days after the monitor has been mailed back to [facility].   Call [place] at [phone] if you have questions regarding your ZIO XT patch monitor.  Call them immediately if you see an orange light blinking on your monitor.   If your monitor falls off in less than 4 days contact our Monitor department at [phone].  If your monitor becomes loose or falls off after 4 days call [employer] at [phone] for suggestions on securing your monitor.

## 2020-01-28 NOTE — Patient Instructions (Addendum)
Medication Instructions:    start Losartan 25 mg one tablet daily   Continue with all other medications   *If you need a refill on your cardiac medications before your next appointment, please call your pharmacy*   Lab Work: Not needed    Testing/Procedures: WILL BE SCHEDULE AT [place] physician has requested that you have cardiac CT. Cardiac computed tomography (CT) is a painless test that uses an x-ray machine to take clear, detailed pictures of your heart.     Please let office know if you would like to proceed with  [place] physician has recommended that you wear a 14  DAY ZIO-PATCH monitor. The Zio patch cardiac monitor continuously records heart rhythm data for up to 14 days, this is for patients being evaluated for multiple types heart rhythms. For the first 24 hours post application, please avoid getting the Zio monitor wet in the shower or by excessive sweating during exercise. After that, feel free to carry on with regular activities. Keep soaps and lotions away from the ZIO XT Patch.  This will be mailed to you, please expect 7-10 days to receive.  [employer] location - [place], [address].         Follow-Up: At [facility], you and your health needs are our priority.  As part of our continuing mission to provide you with exceptional heart care, we have created designated Provider Care Teams.  These Care Teams include your primary Cardiologist (physician) and Advanced Practice Providers (APPs -  Physician Assistants and Nurse Practitioners) who all work together to provide you with the care you need, when you need it.  We recommend signing up for the patient portal called "MyChart".  Sign up information is provided on this After Visit Summary.  MyChart is used to connect with patients for Virtual Visits (Telemedicine).  Patients are able to view lab/test results, encounter notes, upcoming appointments, etc.  Non-urgent  messages can be sent to your provider as well.   To learn more about what you can do with MyChart, go to [email or web address].    Your next appointment:   3 month(s)  The format for your next appointment:   Either In Person or Virtual  Provider:   Cherlynn Kaiser, MD   [place] Monitor Instructions   Your physician has requested you wear your ZIO patch monitor__14_____days.   This is a single patch monitor.  [employer] supplies one patch monitor per enrollment.  Additional stickers are not available.   Please do not apply patch if you will be having a Nuclear Stress Test, Echocardiogram, Cardiac CT, MRI, or Chest Xray during the time frame you would be wearing the monitor. The patch cannot be worn during these tests.  You cannot remove and re-apply the ZIO XT patch monitor.   Your ZIO patch monitor will be sent USPS Priority mail from [facility] directly to your home address. The monitor may also be mailed to a PO BOX if home delivery is not available.   It may take 3-5 days to receive your monitor after you have been enrolled.   Once you have received you monitor, please review enclosed instructions.  Your monitor has already been registered assigning a specific monitor serial # to you.   Applying the monitor   Shave hair from upper left chest.   Hold abrader disc by orange tab.  Rub abrader in 40 strokes over left upper chest  as indicated in your monitor instructions.   Clean area with 4 enclosed alcohol pads .  Use all pads to assure are is cleaned thoroughly.  Let dry.   Apply patch as indicated in monitor instructions.  Patch will be place under collarbone on left side of chest with arrow pointing upward.   Rub patch adhesive wings for 2 minutes.Remove white label marked "1".  Remove white label marked "2".  Rub patch adhesive wings for 2 additional minutes.   While looking in a mirror, press and release button in center of patch.  A small green light will  flash 3-4 times .  This will be your only indicator the monitor has been turned on.     Do not shower for the first 24 hours.  You may shower after the first 24 hours.   Press button if you feel a symptom. You will hear a small click.  Record Date, Time and Symptom in the Patient Log Book.   When you are ready to remove patch, follow instructions on last 2 pages of Patient Log Book.  Stick patch monitor onto last page of Patient Log Book.   Place Patient Log Book in [place] box.  Use locking tab on box and tape box closed securely.  The [employer] has [employer] on it.  Please place in mailbox as soon as possible.  Your physician should have your test results approximately 7 days after the monitor has been mailed back to [facility].   Call [place] at [phone] if you have questions regarding your ZIO XT patch monitor.  Call them immediately if you see an orange light blinking on your monitor.   If your monitor falls off in less than 4 days contact our Monitor department at [phone].  If your monitor becomes loose or falls off after 4 days call [employer] at [phone] for suggestions on securing your monitor.

## 2020-01-28 NOTE — Progress Notes (Signed)
Patient ID: Valerie Reese, female   DOB: 1946-02-24, 74 y.o.   MRN: [id number] [employer] called to check status of 30 day event monitor that was enrolled 12/17/2019.  On 12/28/2019, patient called [employer] and cancelled her 30 day cardiac event monitor.  She stated, it was not the type of monitor she wanted. Request to be sent to Carron Curie and Dr. Margaretann Loveless to please cancel order for cardiac event monitor and place an order for the type of monitor she desired.  If it is a ZIO XT long term holter monitor, the order number would be [id number].

## 2020-01-29 ENCOUNTER — Telehealth: Payer: Self-pay | Admitting: Radiology

## 2020-01-29 NOTE — Telephone Encounter (Signed)
error 

## 2020-01-29 NOTE — Telephone Encounter (Signed)
Enrolled patient for a 14 day Zio monitor to be mailed to patients home.  

## 2020-01-30 ENCOUNTER — Ambulatory Visit: Payer: Medicare Other | Attending: Internal Medicine

## 2020-01-30 DIAGNOSIS — Z23 Encounter for immunization: Secondary | ICD-10-CM

## 2020-01-30 NOTE — Progress Notes (Signed)
   Covid-19 Vaccination Clinic  Name:  Valerie Reese    MRN: [id number] DOB: 10/01/1946  01/30/2020  [name] was observed post Covid-19 immunization for 15 minutes without incident. She was provided with Vaccine Information Sheet and instruction to access the V-Safe system.   [name] was instructed to call 911 with any severe reactions post vaccine: Marland Kitchen Difficulty breathing  . Swelling of face and throat  . A fast heartbeat  . A bad rash all over body  . Dizziness and weakness   Immunizations Administered    Name Date Dose VIS Date Route   Pfizer COVID-19 Vaccine 01/30/2020  8:18 AM 0.3 mL 10/26/2019 Intramuscular   Manufacturer: [place]   Lot: [id number]   [place]: [id number]

## 2020-02-07 ENCOUNTER — Other Ambulatory Visit: Payer: Self-pay

## 2020-02-07 ENCOUNTER — Ambulatory Visit
Admission: RE | Admit: 2020-02-07 | Discharge: 2020-02-07 | Disposition: A | Discharge status: Home / Self Care | Payer: Medicare Other | Source: Ambulatory Visit | Attending: Internal Medicine | Admitting: Internal Medicine

## 2020-02-07 DIAGNOSIS — I77810 Thoracic aortic ectasia: Secondary | ICD-10-CM | POA: Diagnosis not present

## 2020-02-07 MED ORDER — IOPAMIDOL (ISOVUE-370) INJECTION 76%
75.0000 mL | Freq: Once | INTRAVENOUS | Status: AC | PRN
  Administered 2020-02-07: 75 mL via INTRAVENOUS

## 2020-02-07 NOTE — Telephone Encounter (Signed)
[employer] Monitor cancelled.. Monitor returned 02/04/20

## 2020-02-10 ENCOUNTER — Other Ambulatory Visit (INDEPENDENT_AMBULATORY_CARE_PROVIDER_SITE_OTHER): Payer: Medicare Other

## 2020-02-10 DIAGNOSIS — R002 Palpitations: Secondary | ICD-10-CM | POA: Diagnosis not present

## 2020-03-06 DIAGNOSIS — R002 Palpitations: Secondary | ICD-10-CM | POA: Diagnosis not present

## 2020-03-14 ENCOUNTER — Telehealth: Payer: Self-pay | Admitting: *Deleted

## 2020-03-14 MED ORDER — METOPROLOL TARTRATE 25 MG PO TABS: 12.5000 mg | ORAL_TABLET | Freq: Two times a day (BID) | ORAL | 6 refills | Status: DC

## 2020-03-14 NOTE — Telephone Encounter (Signed)
Called   Per dpr , left detail message for patient with results .  Also informed her  information was sent to [facility] for review.  Any question may call back   prescription for metoprolol tartrate 12.5 mg  Twice a day sent to pharmacy

## 2020-03-14 NOTE — Telephone Encounter (Signed)
-----   Message from Elouise Munroe, MD sent at 03/14/2020  3:40 PM EDT ----- Brief episodes of SVT. No atrial fibrillation. No worrisome findings on monitor. If she would like to try a medication to suppress SVT and control palpitations, I would consider starting metoprolol 12.5 mg twice daily. SVT is a non-life threatening heart rhythm, and does not require treatment if patient would prefer not to take therapy for it.

## 2020-05-06 ENCOUNTER — Telehealth: Payer: Medicare Other | Admitting: Internal Medicine

## 2020-08-02 DIAGNOSIS — Z23 Encounter for immunization: Secondary | ICD-10-CM | POA: Diagnosis not present

## 2020-10-08 DIAGNOSIS — H2513 Age-related nuclear cataract, bilateral: Secondary | ICD-10-CM | POA: Diagnosis not present

## 2020-10-08 DIAGNOSIS — H35033 Hypertensive retinopathy, bilateral: Secondary | ICD-10-CM | POA: Diagnosis not present

## 2020-10-08 DIAGNOSIS — H02831 Dermatochalasis of right upper eyelid: Secondary | ICD-10-CM | POA: Diagnosis not present

## 2020-11-24 DIAGNOSIS — H2513 Age-related nuclear cataract, bilateral: Secondary | ICD-10-CM | POA: Diagnosis not present

## 2020-11-24 DIAGNOSIS — H2511 Age-related nuclear cataract, right eye: Secondary | ICD-10-CM | POA: Diagnosis not present

## 2020-12-15 DIAGNOSIS — H2511 Age-related nuclear cataract, right eye: Secondary | ICD-10-CM | POA: Diagnosis not present

## 2020-12-22 DIAGNOSIS — H2512 Age-related nuclear cataract, left eye: Secondary | ICD-10-CM | POA: Diagnosis not present

## 2020-12-30 DIAGNOSIS — H2512 Age-related nuclear cataract, left eye: Secondary | ICD-10-CM | POA: Diagnosis not present

## 2021-02-02 DIAGNOSIS — Z0000 Encounter for general adult medical examination without abnormal findings: Secondary | ICD-10-CM | POA: Diagnosis not present

## 2021-02-03 ENCOUNTER — Other Ambulatory Visit: Payer: Self-pay | Admitting: Family Medicine

## 2021-02-03 DIAGNOSIS — R911 Solitary pulmonary nodule: Secondary | ICD-10-CM

## 2021-02-03 DIAGNOSIS — E039 Hypothyroidism, unspecified: Secondary | ICD-10-CM | POA: Diagnosis not present

## 2021-02-03 DIAGNOSIS — I700 Atherosclerosis of aorta: Secondary | ICD-10-CM | POA: Diagnosis not present

## 2021-02-03 DIAGNOSIS — I471 Supraventricular tachycardia: Secondary | ICD-10-CM | POA: Diagnosis not present

## 2021-02-03 DIAGNOSIS — I10 Essential (primary) hypertension: Secondary | ICD-10-CM | POA: Diagnosis not present

## 2021-02-05 ENCOUNTER — Other Ambulatory Visit: Payer: Self-pay | Admitting: Family Medicine

## 2021-02-05 DIAGNOSIS — Z1211 Encounter for screening for malignant neoplasm of colon: Secondary | ICD-10-CM | POA: Diagnosis not present

## 2021-02-05 DIAGNOSIS — Z1231 Encounter for screening mammogram for malignant neoplasm of breast: Secondary | ICD-10-CM

## 2021-02-10 ENCOUNTER — Ambulatory Visit
Admission: RE | Admit: 2021-02-10 | Discharge: 2021-02-10 | Disposition: A | Discharge status: Home / Self Care | Payer: Medicare Other | Source: Ambulatory Visit | Attending: Family Medicine | Admitting: Family Medicine

## 2021-02-10 DIAGNOSIS — R918 Other nonspecific abnormal finding of lung field: Secondary | ICD-10-CM | POA: Diagnosis not present

## 2021-02-10 DIAGNOSIS — R911 Solitary pulmonary nodule: Secondary | ICD-10-CM

## 2021-03-11 ENCOUNTER — Encounter: Payer: Self-pay | Admitting: Pulmonary Disease

## 2021-03-31 ENCOUNTER — Other Ambulatory Visit: Payer: Self-pay

## 2021-03-31 ENCOUNTER — Ambulatory Visit
Admission: RE | Admit: 2021-03-31 | Discharge: 2021-03-31 | Disposition: A | Discharge status: Home / Self Care | Payer: Medicare Other | Source: Ambulatory Visit | Attending: Family Medicine | Admitting: Family Medicine

## 2021-03-31 DIAGNOSIS — Z1231 Encounter for screening mammogram for malignant neoplasm of breast: Secondary | ICD-10-CM | POA: Diagnosis not present

## 2021-04-15 ENCOUNTER — Other Ambulatory Visit: Payer: Self-pay

## 2021-04-15 ENCOUNTER — Encounter: Payer: Self-pay | Admitting: Pulmonary Disease

## 2021-04-15 ENCOUNTER — Ambulatory Visit: Payer: Medicare Other | Admitting: Pulmonary Disease

## 2021-04-15 VITALS — BP 134/78 | HR 77 | Temp 98.3°F | Ht 60.0 in | Wt 137.6 lb

## 2021-04-15 DIAGNOSIS — R918 Other nonspecific abnormal finding of lung field: Secondary | ICD-10-CM

## 2021-04-15 NOTE — Patient Instructions (Addendum)
Pulmonary nodules Largest is the LLL nodule measuring ~9-79mm with scattered micronodules and faint ground glass opacity 5 mm --CT Chest without contrast in 6 months --If enlarging, will consider PET scan otherwise will continue surveillance monitoring  Follow-up with me after scan

## 2021-04-15 NOTE — Progress Notes (Signed)
Subjective:   PATIENT ID: Valerie Reese GENDER: female DOB: 1946-06-29, MRN: [id number]   HPI  Chief Complaint  Patient presents with   Follow-up    Nodule left lung. No current respiratory concerns.     Reason for Visit: New consult for lung nodule  Valerie Reese is a 75 year old female former smoker (20 pack-years) with HTN and hypothyroidism who presents as a new patient. Her family medicine physician, Dr. Sabra Heck, referred patient to Pulmonary for enlarging lung nodule. After retirement, she was worked up for heart palpitations that has resolved. Attributed to stress. Denies shortness of breath, cough, wheezing, chest pain.  Social History: Former smoker. 20 pack-years. Quit in 1988. Smoked <1ppd. Started in 20s. Stopped in late 70s.   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  Past Medical History:  Diagnosis Date   Allergic rhinitis    Hypertension    Hypothyroid    age 75   Hypothyroidism      Family History  Problem Relation Age of Onset   Aneurysm Mother    Fibroids Father    Pulmonary disease Father    Hypertension Father    Hypertension Brother    Lung cancer Maternal Grandfather      Social History   Occupational History   Occupation: Retired  Tobacco Use   Smoking status: Former Smoker    Packs/day: 1.00    Years: 20.00    Pack years: 20.00    Types: Cigarettes    Quit date: 1988    Years since quitting: 34.4   Smokeless tobacco: Never Used  Scientific laboratory technician Use: Never used  Substance and Sexual Activity   Alcohol use: Yes    Comment: occasional    Drug use: Not on file   Sexual activity: Not on file    No Known Allergies   Outpatient Medications Prior to Visit  Medication Sig Dispense Refill   atorvastatin (LIPITOR) 10 MG tablet Take 1 tablet by mouth  daily.     Cholecalciferol (VITAMIN D) 125 MCG (5000 UT) CAPS Take by mouth.     Coenzyme Q10 (CO Q 10 PO) Take by mouth.     levothyroxine (SYNTHROID) 112 MCG tablet Take 112 mcg by mouth daily before breakfast.     losartan (COZAAR) 25 MG tablet Take 1 tablet (25 mg total) by mouth daily. 90 tablet 3   Multiple Vitamins-Minerals (EMERGEN-C IMMUNE PO) Take by mouth.     TURMERIC PO Take by mouth.     metoprolol tartrate (LOPRESSOR) 25 MG tablet Take 0.5 tablets (12.5 mg total) by mouth 2 (two) times daily. 30 tablet 6   No facility-administered medications prior to visit.    Review of Systems  Constitutional:  Negative for chills, diaphoresis, fever, malaise/fatigue and weight loss.  HENT:  Negative for congestion, ear pain and sore throat.   Respiratory:  Negative for cough, hemoptysis, sputum production, shortness of breath and wheezing.   Cardiovascular:  Negative for chest pain, palpitations and leg swelling.  Gastrointestinal:  Negative for abdominal pain, heartburn and nausea.  Genitourinary:  Negative for frequency.  Musculoskeletal:  Negative for joint pain and myalgias.  Skin:  Negative for itching and rash.  Neurological:  Negative for dizziness, weakness and headaches.  Endo/Heme/Allergies:  Does not bruise/bleed easily.  Psychiatric/Behavioral:  Negative for depression. The patient is not nervous/anxious.  Objective:   Vitals:   04/15/21 1107  BP: 134/78  Pulse: 77  Temp: 98.3 F (36.8 C)  TempSrc: Temporal  SpO2: 99%  Weight: 137 lb 9.6 oz (62.4 kg)  Height: 5' (1.524 m)   SpO2: 99 % (RA) O2 Device: None (Room air)  Physical Exam: General: Well-appearing, no acute distress HENT: [place], AT Eyes: EOMI, no scleral icterus Respiratory: Clear to auscultation bilaterally.  No crackles, wheezing or rales Cardiovascular: RRR, -M/R/G, no JVD Extremities:-Edema,-tenderness Neuro: AAO x4, CNII-XII grossly intact Skin: Intact, no rashes or bruising Psych: Normal  mood, normal affect  Data Reviewed:  Imaging: CT Angio Chest Aort 02/07/20 - No enlarged hilar or mediastinal adenopathy. LLL with 7 mm nodule CT Chest 02/10/21 - Mild paraseptal emphysema. Compared to last scan LUL faint GGO measuring 81mm. LLL nodule increased from 7 to 42mm. Multiple stable calcified and noncalcified pulmonary micronodule in the right upper and right mid lobes  PFT: None on file  Labs: CBC No results found for: WBC, RBC, HGB, HCT, PLT, MCV, MCH, MCHC, RDW, LYMPHSABS, MONOABS, EOSABS, BASOSABS BMET No results found for: NA, K, CL, CO2, GLUCOSE, BUN, CREATININE, CALCIUM, GFRNONAA, GFRAA  Imaging, labs and test noted above have been reviewed independently by me.     Assessment & Plan:   Discussion: 75 year old female former smoker (20 pack-years) with intermediate risk (~15%) of malignancy. We reviewed imaging and discussed surveillance vs tissue sampling. Given the size of the nodule, would recommend ongoing surveillance. Patient expressed understanding and agreed to plan.  Pulmonary nodules Largest is the LLL nodule measuring ~9-49mm with scattered micronodules and faint ground glass opacity 5 mm --CT Chest without contrast in 6 months --If enlarging, will consider PET scan otherwise will continue surveillance monitoring  Health Maintenance Immunization History  Administered Date(s) Administered   Influenza,inj,Quad PF,6+ Mos 08/28/2019   Influenza-Unspecified 08/02/2020   PFIZER(Purple Top)SARS-COV-2 Vaccination 01/06/2020, 01/30/2020, 08/18/2020   Rabies, IM 12/07/2019, 12/10/2019, 12/14/2019, 12/21/2019   CT Lung Screen - as noted above  Orders Placed This Encounter  Procedures   CT Chest Wo Contrast    Please schedule for September 2022    Standing Status:   Future    Standing Expiration Date:   04/15/2022    Order Specific Question:   Preferred imaging location?    Answer:   [place]  No orders of the defined types were placed in this  encounter.   Return in about 6 months (around 10/15/2021).  I have spent a total time of 45-minutes on the day of the appointment reviewing prior documentation, coordinating care and discussing medical diagnosis and plan with the patient/family. Imaging, labs and tests included in this note have been reviewed and interpreted independently by me.  [place], MD [facility] 04/15/2021 11:26 AM  Office Number [phone]

## 2021-05-06 DIAGNOSIS — R918 Other nonspecific abnormal finding of lung field: Secondary | ICD-10-CM | POA: Insufficient documentation

## 2021-07-22 ENCOUNTER — Ambulatory Visit
Admission: RE | Admit: 2021-07-22 | Discharge: 2021-07-22 | Disposition: A | Discharge status: Home / Self Care | Payer: Medicare Other | Source: Ambulatory Visit | Attending: Pulmonary Disease | Admitting: Pulmonary Disease

## 2021-07-22 DIAGNOSIS — I700 Atherosclerosis of aorta: Secondary | ICD-10-CM | POA: Diagnosis not present

## 2021-07-22 DIAGNOSIS — R918 Other nonspecific abnormal finding of lung field: Secondary | ICD-10-CM

## 2021-07-22 DIAGNOSIS — R911 Solitary pulmonary nodule: Secondary | ICD-10-CM | POA: Diagnosis not present

## 2021-07-28 ENCOUNTER — Telehealth: Payer: Self-pay | Admitting: Pulmonary Disease

## 2021-07-28 NOTE — Telephone Encounter (Signed)
I called the pt and LM on VM for her to call back to get her scheduled to see JE to review her results of scan.    Looks like a recall was placed but then the appt was never scheduled.  JE would like the pt scheduled either 9/14, 09/29 or 09/30 on her schedule.  OK TO USE A BLOCKED SPOT ON THESE DAYS ONLY.  thanks

## 2021-07-30 NOTE — Telephone Encounter (Signed)
LM informing patient we got her message and we will not place the referral to the cancer screening program.   Nothing further needed at this time.

## 2021-07-30 NOTE — Progress Notes (Signed)
Mychart message sent: The lung nodules we are following are stable including the ground glass opacity in the right upper lobe. Given the size, no further follow-up indicated. However with your smoking history, we can start annual low-dose CT scans to monitor for any new lung nodules in the future. Would you be ok with [place] referring you to [facility]?

## 2021-08-03 DIAGNOSIS — I10 Essential (primary) hypertension: Secondary | ICD-10-CM | POA: Diagnosis not present

## 2021-08-03 DIAGNOSIS — I712 Thoracic aortic aneurysm, without rupture: Secondary | ICD-10-CM | POA: Diagnosis not present

## 2021-08-03 DIAGNOSIS — R9389 Abnormal findings on diagnostic imaging of other specified body structures: Secondary | ICD-10-CM | POA: Diagnosis not present

## 2021-08-03 DIAGNOSIS — E039 Hypothyroidism, unspecified: Secondary | ICD-10-CM | POA: Diagnosis not present

## 2021-08-03 DIAGNOSIS — E7800 Pure hypercholesterolemia, unspecified: Secondary | ICD-10-CM | POA: Diagnosis not present

## 2021-08-10 DIAGNOSIS — L309 Dermatitis, unspecified: Secondary | ICD-10-CM | POA: Diagnosis not present

## 2021-08-10 DIAGNOSIS — Z961 Presence of intraocular lens: Secondary | ICD-10-CM | POA: Diagnosis not present

## 2021-08-10 DIAGNOSIS — H35033 Hypertensive retinopathy, bilateral: Secondary | ICD-10-CM | POA: Diagnosis not present

## 2021-08-10 DIAGNOSIS — H02831 Dermatochalasis of right upper eyelid: Secondary | ICD-10-CM | POA: Diagnosis not present

## 2021-09-25 DIAGNOSIS — H00012 Hordeolum externum right lower eyelid: Secondary | ICD-10-CM | POA: Diagnosis not present

## 2022-01-12 ENCOUNTER — Other Ambulatory Visit: Payer: Self-pay | Admitting: Family Medicine

## 2022-01-13 ENCOUNTER — Other Ambulatory Visit: Payer: Self-pay | Admitting: Family Medicine

## 2022-01-13 DIAGNOSIS — I712 Thoracic aortic aneurysm, without rupture, unspecified: Secondary | ICD-10-CM

## 2022-01-29 ENCOUNTER — Ambulatory Visit
Admission: RE | Admit: 2022-01-29 | Discharge: 2022-01-29 | Disposition: A | Discharge status: Home / Self Care | Payer: Medicare Other | Source: Ambulatory Visit | Attending: Family Medicine | Admitting: Family Medicine

## 2022-01-29 ENCOUNTER — Other Ambulatory Visit: Payer: Self-pay

## 2022-01-29 DIAGNOSIS — I712 Thoracic aortic aneurysm, without rupture, unspecified: Secondary | ICD-10-CM

## 2022-02-17 DIAGNOSIS — E7800 Pure hypercholesterolemia, unspecified: Secondary | ICD-10-CM | POA: Diagnosis not present

## 2022-02-17 DIAGNOSIS — I712 Thoracic aortic aneurysm, without rupture, unspecified: Secondary | ICD-10-CM | POA: Diagnosis not present

## 2022-02-17 DIAGNOSIS — I700 Atherosclerosis of aorta: Secondary | ICD-10-CM | POA: Diagnosis not present

## 2022-02-17 DIAGNOSIS — E039 Hypothyroidism, unspecified: Secondary | ICD-10-CM | POA: Diagnosis not present

## 2022-02-17 DIAGNOSIS — I10 Essential (primary) hypertension: Secondary | ICD-10-CM | POA: Diagnosis not present

## 2022-02-17 DIAGNOSIS — Z0000 Encounter for general adult medical examination without abnormal findings: Secondary | ICD-10-CM | POA: Diagnosis not present

## 2022-02-17 DIAGNOSIS — R413 Other amnesia: Secondary | ICD-10-CM | POA: Diagnosis not present

## 2022-02-17 DIAGNOSIS — E559 Vitamin D deficiency, unspecified: Secondary | ICD-10-CM | POA: Diagnosis not present

## 2022-02-17 DIAGNOSIS — I471 Supraventricular tachycardia: Secondary | ICD-10-CM | POA: Diagnosis not present

## 2022-02-17 DIAGNOSIS — Z23 Encounter for immunization: Secondary | ICD-10-CM | POA: Diagnosis not present

## 2022-02-17 DIAGNOSIS — W19XXXD Unspecified fall, subsequent encounter: Secondary | ICD-10-CM | POA: Diagnosis not present

## 2022-02-18 ENCOUNTER — Other Ambulatory Visit: Payer: Self-pay | Admitting: Family Medicine

## 2022-02-18 DIAGNOSIS — Z1231 Encounter for screening mammogram for malignant neoplasm of breast: Secondary | ICD-10-CM

## 2022-02-18 DIAGNOSIS — I712 Thoracic aortic aneurysm, without rupture, unspecified: Secondary | ICD-10-CM

## 2022-02-18 DIAGNOSIS — E2839 Other primary ovarian failure: Secondary | ICD-10-CM

## 2022-02-25 DIAGNOSIS — H6123 Impacted cerumen, bilateral: Secondary | ICD-10-CM | POA: Diagnosis not present

## 2022-03-25 ENCOUNTER — Ambulatory Visit
Admission: RE | Admit: 2022-03-25 | Discharge: 2022-03-25 | Disposition: A | Discharge status: Home / Self Care | Payer: Medicare Other | Source: Ambulatory Visit | Attending: Family Medicine | Admitting: Family Medicine

## 2022-03-25 DIAGNOSIS — J8410 Pulmonary fibrosis, unspecified: Secondary | ICD-10-CM | POA: Diagnosis not present

## 2022-03-25 DIAGNOSIS — R911 Solitary pulmonary nodule: Secondary | ICD-10-CM | POA: Diagnosis not present

## 2022-03-25 DIAGNOSIS — I712 Thoracic aortic aneurysm, without rupture, unspecified: Secondary | ICD-10-CM

## 2022-03-25 DIAGNOSIS — I700 Atherosclerosis of aorta: Secondary | ICD-10-CM | POA: Diagnosis not present

## 2022-03-25 MED ORDER — IOPAMIDOL (ISOVUE-370) INJECTION 76%
75.0000 mL | Freq: Once | INTRAVENOUS | Status: AC | PRN
  Administered 2022-03-25: 75 mL via INTRAVENOUS

## 2022-04-01 ENCOUNTER — Ambulatory Visit
Admission: RE | Admit: 2022-04-01 | Discharge: 2022-04-01 | Disposition: A | Discharge status: Home / Self Care | Payer: Medicare Other | Source: Ambulatory Visit | Attending: Family Medicine | Admitting: Family Medicine

## 2022-04-01 DIAGNOSIS — Z1231 Encounter for screening mammogram for malignant neoplasm of breast: Secondary | ICD-10-CM | POA: Diagnosis not present

## 2022-06-28 DIAGNOSIS — R413 Other amnesia: Secondary | ICD-10-CM | POA: Diagnosis not present

## 2022-06-28 DIAGNOSIS — E559 Vitamin D deficiency, unspecified: Secondary | ICD-10-CM | POA: Diagnosis not present

## 2022-07-05 DIAGNOSIS — M25562 Pain in left knee: Secondary | ICD-10-CM | POA: Diagnosis not present

## 2022-07-05 DIAGNOSIS — M25552 Pain in left hip: Secondary | ICD-10-CM | POA: Diagnosis not present

## 2022-07-14 DIAGNOSIS — M25562 Pain in left knee: Secondary | ICD-10-CM | POA: Diagnosis not present

## 2022-07-14 DIAGNOSIS — M25552 Pain in left hip: Secondary | ICD-10-CM | POA: Diagnosis not present

## 2022-07-16 DIAGNOSIS — M25562 Pain in left knee: Secondary | ICD-10-CM | POA: Diagnosis not present

## 2022-07-16 DIAGNOSIS — M25552 Pain in left hip: Secondary | ICD-10-CM | POA: Diagnosis not present

## 2022-07-22 DIAGNOSIS — M25562 Pain in left knee: Secondary | ICD-10-CM | POA: Diagnosis not present

## 2022-07-22 DIAGNOSIS — M25552 Pain in left hip: Secondary | ICD-10-CM | POA: Diagnosis not present

## 2022-07-26 DIAGNOSIS — M25562 Pain in left knee: Secondary | ICD-10-CM | POA: Diagnosis not present

## 2022-07-26 DIAGNOSIS — M25552 Pain in left hip: Secondary | ICD-10-CM | POA: Diagnosis not present

## 2022-07-28 DIAGNOSIS — M25562 Pain in left knee: Secondary | ICD-10-CM | POA: Diagnosis not present

## 2022-07-28 DIAGNOSIS — M25552 Pain in left hip: Secondary | ICD-10-CM | POA: Diagnosis not present

## 2022-07-29 DIAGNOSIS — Z23 Encounter for immunization: Secondary | ICD-10-CM | POA: Diagnosis not present

## 2022-08-03 DIAGNOSIS — M25552 Pain in left hip: Secondary | ICD-10-CM | POA: Diagnosis not present

## 2022-08-03 DIAGNOSIS — M25562 Pain in left knee: Secondary | ICD-10-CM | POA: Diagnosis not present

## 2022-08-05 DIAGNOSIS — M25552 Pain in left hip: Secondary | ICD-10-CM | POA: Diagnosis not present

## 2022-08-05 DIAGNOSIS — M25562 Pain in left knee: Secondary | ICD-10-CM | POA: Diagnosis not present

## 2022-08-11 DIAGNOSIS — H02831 Dermatochalasis of right upper eyelid: Secondary | ICD-10-CM | POA: Diagnosis not present

## 2022-08-11 DIAGNOSIS — Z961 Presence of intraocular lens: Secondary | ICD-10-CM | POA: Diagnosis not present

## 2022-08-11 DIAGNOSIS — H35033 Hypertensive retinopathy, bilateral: Secondary | ICD-10-CM | POA: Diagnosis not present

## 2022-08-13 ENCOUNTER — Ambulatory Visit
Admission: RE | Admit: 2022-08-13 | Discharge: 2022-08-13 | Disposition: A | Discharge status: Home / Self Care | Payer: Medicare Other | Source: Ambulatory Visit | Attending: Family Medicine | Admitting: Family Medicine

## 2022-08-13 DIAGNOSIS — M8589 Other specified disorders of bone density and structure, multiple sites: Secondary | ICD-10-CM | POA: Diagnosis not present

## 2022-08-13 DIAGNOSIS — M810 Age-related osteoporosis without current pathological fracture: Secondary | ICD-10-CM | POA: Diagnosis not present

## 2022-08-13 DIAGNOSIS — E2839 Other primary ovarian failure: Secondary | ICD-10-CM

## 2022-08-13 DIAGNOSIS — Z780 Asymptomatic menopausal state: Secondary | ICD-10-CM | POA: Diagnosis not present

## 2022-08-25 DIAGNOSIS — M25552 Pain in left hip: Secondary | ICD-10-CM | POA: Diagnosis not present

## 2022-08-25 DIAGNOSIS — M25562 Pain in left knee: Secondary | ICD-10-CM | POA: Diagnosis not present

## 2022-09-01 DIAGNOSIS — M25562 Pain in left knee: Secondary | ICD-10-CM | POA: Diagnosis not present

## 2022-09-01 DIAGNOSIS — M25552 Pain in left hip: Secondary | ICD-10-CM | POA: Diagnosis not present

## 2022-09-03 DIAGNOSIS — M25562 Pain in left knee: Secondary | ICD-10-CM | POA: Diagnosis not present

## 2022-09-03 DIAGNOSIS — M25552 Pain in left hip: Secondary | ICD-10-CM | POA: Diagnosis not present

## 2022-09-08 DIAGNOSIS — M25562 Pain in left knee: Secondary | ICD-10-CM | POA: Diagnosis not present

## 2022-09-08 DIAGNOSIS — M25552 Pain in left hip: Secondary | ICD-10-CM | POA: Diagnosis not present

## 2022-09-10 DIAGNOSIS — M25552 Pain in left hip: Secondary | ICD-10-CM | POA: Diagnosis not present

## 2022-09-10 DIAGNOSIS — M25562 Pain in left knee: Secondary | ICD-10-CM | POA: Diagnosis not present

## 2022-09-15 DIAGNOSIS — M25562 Pain in left knee: Secondary | ICD-10-CM | POA: Diagnosis not present

## 2022-09-15 DIAGNOSIS — M25552 Pain in left hip: Secondary | ICD-10-CM | POA: Diagnosis not present

## 2022-09-17 DIAGNOSIS — M25552 Pain in left hip: Secondary | ICD-10-CM | POA: Diagnosis not present

## 2022-09-17 DIAGNOSIS — M25562 Pain in left knee: Secondary | ICD-10-CM | POA: Diagnosis not present

## 2022-09-22 DIAGNOSIS — M25552 Pain in left hip: Secondary | ICD-10-CM | POA: Diagnosis not present

## 2022-09-22 DIAGNOSIS — M25562 Pain in left knee: Secondary | ICD-10-CM | POA: Diagnosis not present

## 2022-09-24 DIAGNOSIS — M25552 Pain in left hip: Secondary | ICD-10-CM | POA: Diagnosis not present

## 2022-09-24 DIAGNOSIS — M25562 Pain in left knee: Secondary | ICD-10-CM | POA: Diagnosis not present

## 2022-11-11 IMAGING — CT CT CHEST W/O CM
2 of 4 series · 15 of 36 positions shown, 18 images · non-contrast
Comparison: CT angiography chest 02/07/2020

CLINICAL DATA: Follow-up left lower lobe nodules.  Former smoker.

EXAM:
CT CHEST WITHOUT CONTRAST
TECHNIQUE: Multidetector CT imaging of the chest was performed following the
standard protocol without IV contrast.

[Series 2: chest 2.00 br40 s3 · axial · 0.68mm/px · z∈[+1454,+1718]mm · 12 of 156 slices shown, 15 images (1 of 2)]
[im 12/156  mediastinal]
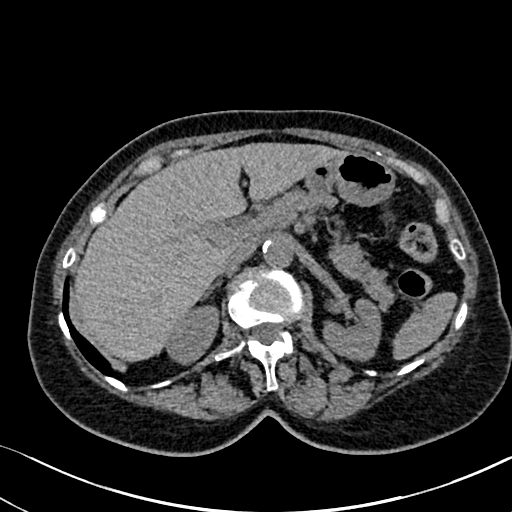
[im 12/156  lung]
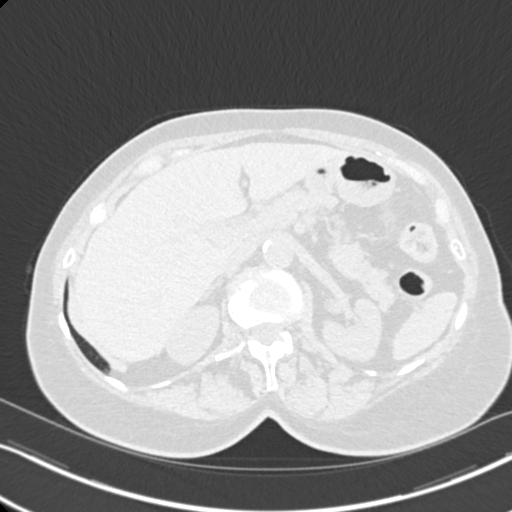
[im 24/156  lung]
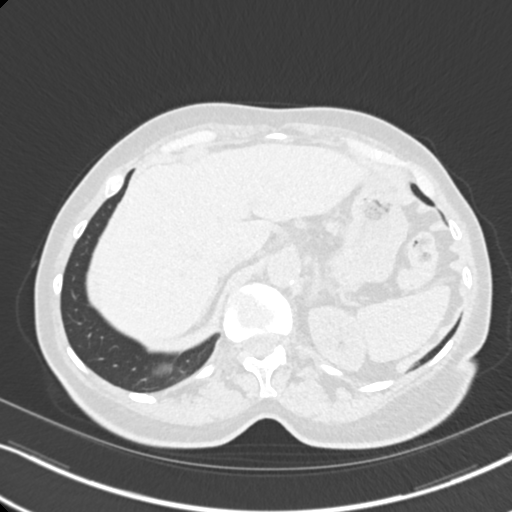
[im 36/156  lung]
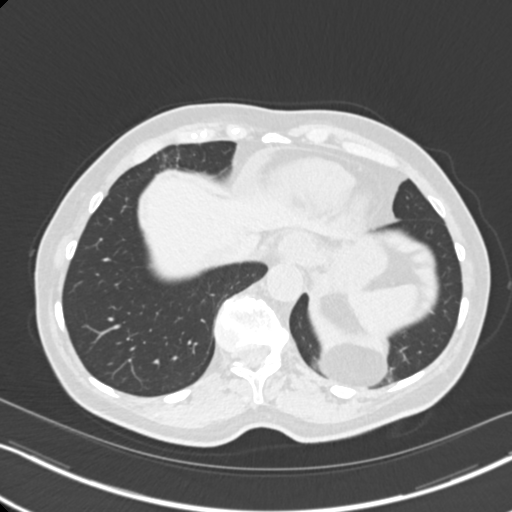
[im 48/156  lung]
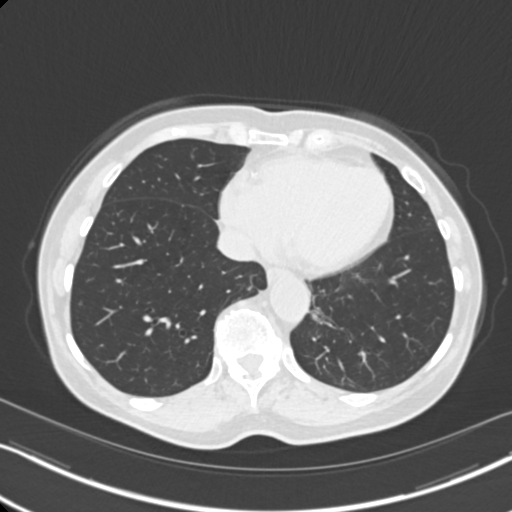
[im 60/156  mediastinal]
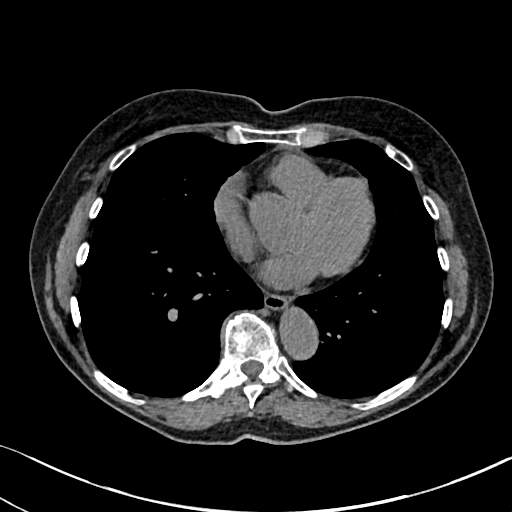
[im 60/156  lung]
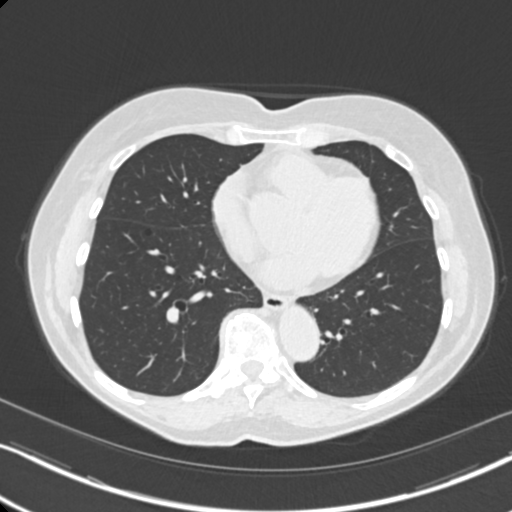
[im 72/156  lung]
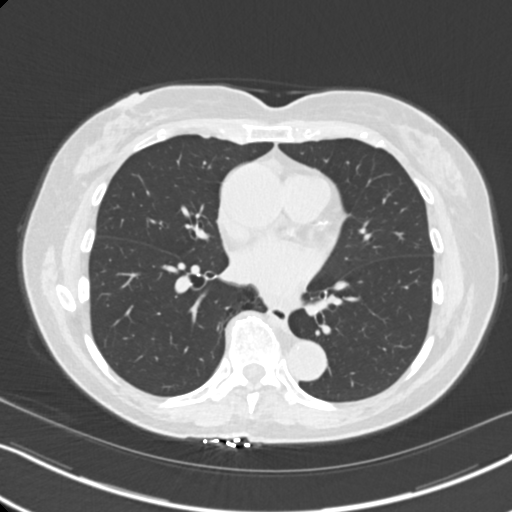
[im 84/156  lung]
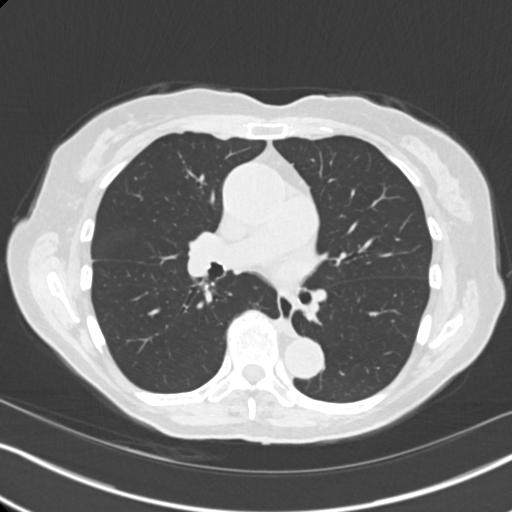
[im 96/156  lung]
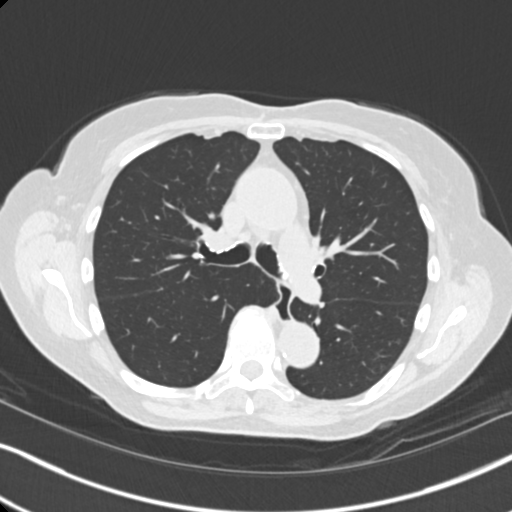
[im 108/156  mediastinal]
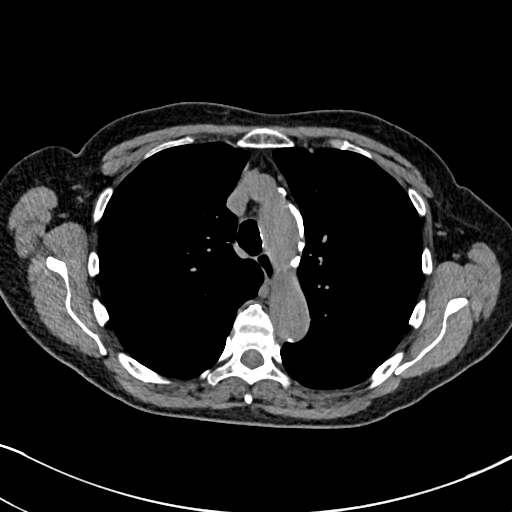
[im 108/156  lung]
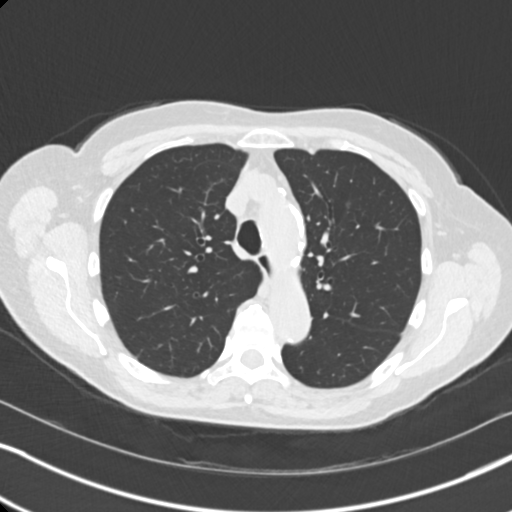
[im 120/156  lung]
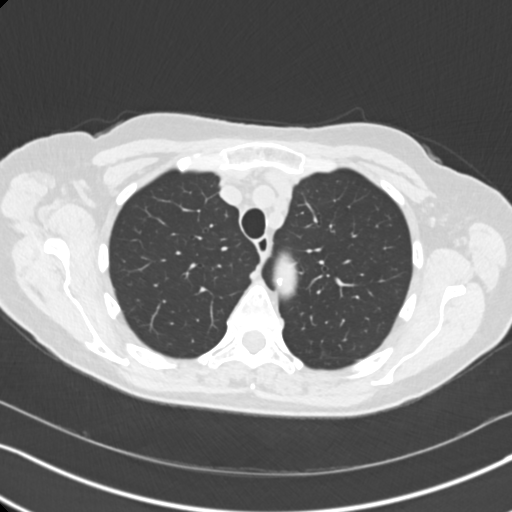
[im 132/156  lung]
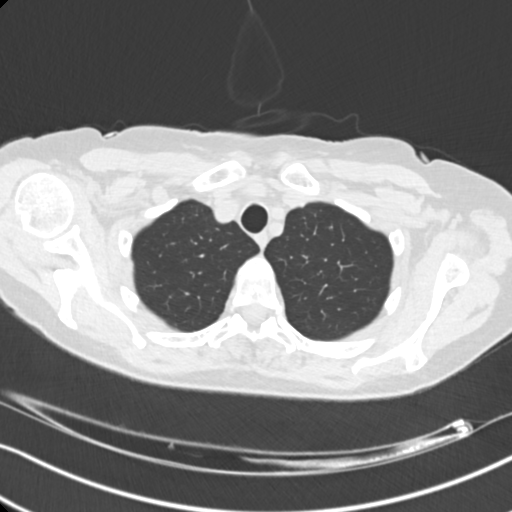
[im 144/156  lung]
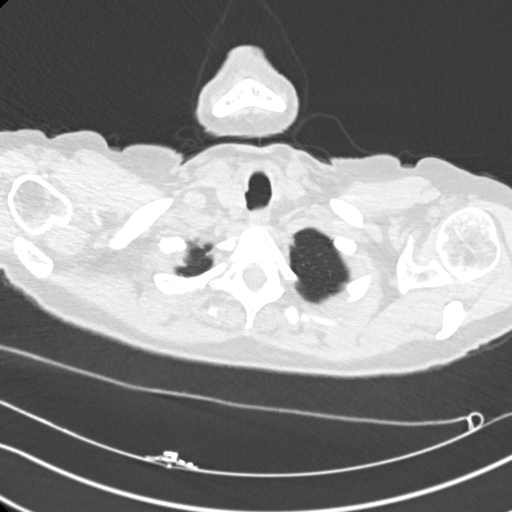

[Series 4: chest 2.00 br40 s3 · coronal · 0.61mm/px · 3 of 174 slices shown (2 of 2)]
[im 35/174  lung]
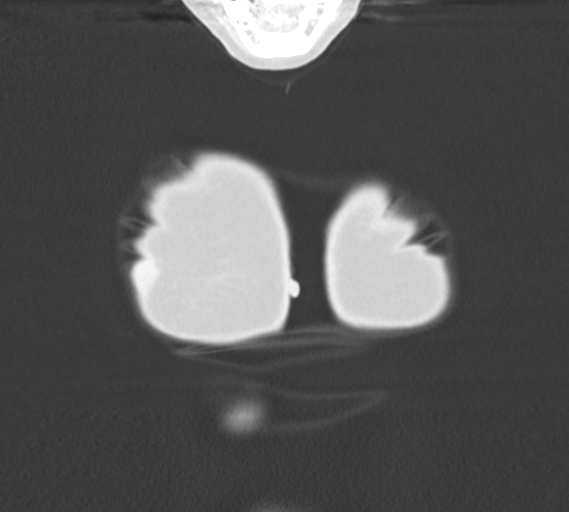
[im 70/174  lung]
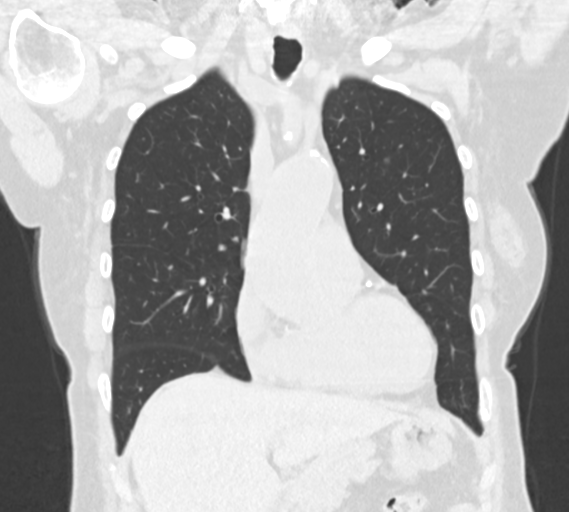
[im 104/174  lung]
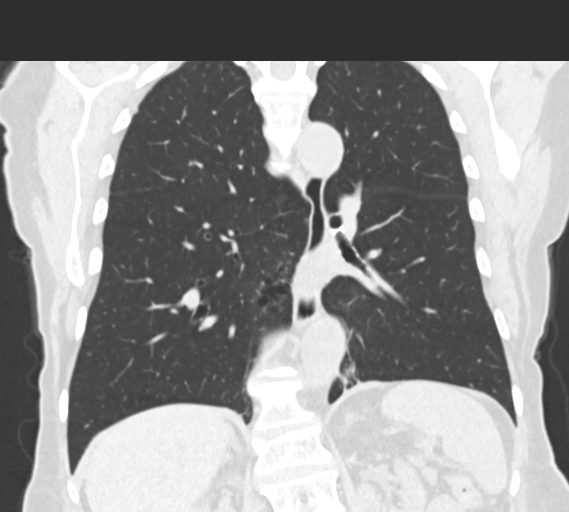

[15 of 36 positions shown; findings below may reference images not displayed]

FINDINGS: Cardiovascular: Normal heart size. No significant pericardial
effusion. Interval increase in caliber of the ascending aorta now
measuring 4.3 cm (from 3.8 cm). The descending thoracic aorta is
normal in caliber. Mild atherosclerotic plaque of the thoracic
aorta. Mild coronary artery calcifications. The main pulmonary
artery is normal in caliber.

Mediastinum/Nodes: No enlarged mediastinal or axillary lymph nodes.
Thyroid gland, trachea, and esophagus demonstrate no significant
findings. Small fat containing left Bochdalek's hernia.

Lungs/Pleura:

Mild paraseptal emphysematous changes.

Interval development of a 5 mm ground-glass pulmonary nodule within
the left upper lobe ([DATE]).

Slightly increased in size 1 cm (from 0.7 cm) left lower lobe
nodule. Question satellite pulmonary micronodule just medial and
superior to this lesion ([DATE], [DATE]). Associated surrounding
reticulations. Stable subpleural pulmonary micronodule within the
left lower lobe ([DATE]).

Several stable calcified and noncalcified pulmonary micronodules are
noted within the right upper and right mid lobes.

Upper Abdomen: No acute abnormality.

Musculoskeletal:

No chest wall abnormality.

No suspicious lytic or blastic osseous lesions. No acute displaced
fracture. Multilevel degenerative changes of the spine. Degenerative
changes of bilateral shoulders.
IMPRESSION: 1. Interval development of an ascending aorta aneurysm measuring up
to 4.3 cm. Recommend annual imaging followup by CTA or MRA. This
recommendation follows 7858
ACCF/AHA/AATS/ACR/ASA/SCA/NEGERI SEMBILAN/OSTOS/TOMISLAV-JOSIPA/SWEITZER Guidelines for the
Diagnosis and Management of Patients with Thoracic Aortic Disease.
Circulation. 7858; 121: E266-e369. Aortic aneurysm NOS
([id number]).
2. Slightly increased in size 1 cm (from 0.7 cm) left lower lobe
nodule. Question satellite pulmonary micronodule just medial and
superior to this lesion as well as associated surrounding
reticulations. Additional imaging evaluation or consultation with
Pulmonology or Thoracic Surgery recommended.
3. Interval development of a 5 mm ground-glass pulmonary nodule
within the left upper lobe. No follow-up recommended. This
recommendation follows the consensus statement: Guidelines for
Management of Incidental Pulmonary Nodules Detected on CT Images:
4. Sequelae of prior granulomatous disease.

## 2023-01-25 ENCOUNTER — Ambulatory Visit (INDEPENDENT_AMBULATORY_CARE_PROVIDER_SITE_OTHER): Payer: Medicare Other | Admitting: Family

## 2023-01-25 ENCOUNTER — Encounter: Payer: Self-pay | Admitting: Family

## 2023-01-25 VITALS — BP 138/88 | HR 70 | Temp 97.5°F | Resp 14 | Ht 61.42 in | Wt 132.0 lb

## 2023-01-25 DIAGNOSIS — E039 Hypothyroidism, unspecified: Secondary | ICD-10-CM | POA: Diagnosis not present

## 2023-01-25 DIAGNOSIS — M25562 Pain in left knee: Secondary | ICD-10-CM

## 2023-01-25 DIAGNOSIS — I10 Essential (primary) hypertension: Secondary | ICD-10-CM

## 2023-01-25 DIAGNOSIS — E785 Hyperlipidemia, unspecified: Secondary | ICD-10-CM | POA: Diagnosis not present

## 2023-01-25 DIAGNOSIS — H6123 Impacted cerumen, bilateral: Secondary | ICD-10-CM | POA: Diagnosis not present

## 2023-01-25 DIAGNOSIS — G8929 Other chronic pain: Secondary | ICD-10-CM

## 2023-01-25 DIAGNOSIS — M79652 Pain in left thigh: Secondary | ICD-10-CM | POA: Diagnosis not present

## 2023-01-25 MED ORDER — DEBROX 6.5 % OT SOLN: 5.0000 [drp] | Freq: Two times a day (BID) | OTIC | 0 refills | Status: AC

## 2023-01-25 NOTE — Progress Notes (Signed)
Provider: Lotus Santillo FNP-C   Kathyrn Lass, MD  Patient Care Team: Kathyrn Lass, MD as PCP - General [facility])  Extended Emergency Contact Information Primary Emergency Contact: [facility] Address: [address]          [place], [place] [address] Johnnette Litter of [place] Mobile Phone: [phone] Relation: Brother  Code Status:  Full Code  Goals of care: Advanced Directive information    12/07/2019   12:59 PM  Advanced Directives  Does Patient Have a Medical Advance Directive? No     Chief Complaint  Patient presents with   Establish Care    New patient to establish care Has pain in left thigh fell last year never evaluated but pain 6/10 all the time.     HPI:  Pt is a 77 y.o. female seen today establish care here at [facility] for medical management of chronic diseases.Has a medical history of essential Hypertension,Hypothyroidism ,Hyperlipidemia,chronic left knee pain,Former cigarette smoker among others.  She complains of worsening left knee pain and thigh area.wears knee brace to prevent knee from giving out.left inner thigh pain worst after she fell one year ago.No pain to hip area. Also denies any numbness,tingling or weakness of leg.  Does exercise by walking three dogs daily for one hour.  State smoked cigarettes one pack per day for 20 years. No other use of illicit drugs or alcohol.    Due for PNA and Tdap vaccine.agrees to get her PNA vaccine today.No URI symptoms reported. Advised to get Tdap vaccine at the pharmacy.   Past Medical History:  Diagnosis Date   Allergic rhinitis    Hypertension    Hypothyroid    age 49   Hypothyroidism    Past Surgical History:  Procedure Laterality Date   ECTOPIC PREGNANCY SURGERY      No Known Allergies  Allergies as of 01/25/2023   No Known Allergies      Medication List        Accurate as of January 25, 2023 10:31 AM. If you have any questions, ask your nurse or doctor.           STOP taking these medications    metoprolol tartrate 25 MG tablet Commonly known as: LOPRESSOR Stopped by: Sandrea Hughs, NP       TAKE these medications    atorvastatin 10 MG tablet Commonly known as: LIPITOR Take 1 tablet by mouth daily.   B-12 PO Take by mouth.   CO Q 10 PO Take by mouth.   EMERGEN-C IMMUNE PO Take by mouth.   K2 PO Take by mouth.   levothyroxine 112 MCG tablet Commonly known as: SYNTHROID Take 112 mcg by mouth daily before breakfast.   losartan 25 MG tablet Commonly known as: COZAAR Take 1 tablet (25 mg total) by mouth daily.   TURMERIC PO Take by mouth.   Vitamin D 125 MCG (5000 UT) Caps Take by mouth.        Review of Systems  Constitutional:  Negative for appetite change, chills, fatigue, fever and unexpected weight change.  HENT:  Negative for congestion, dental problem, ear discharge, ear pain, facial swelling, hearing loss, nosebleeds, postnasal drip, rhinorrhea, sinus pressure, sinus pain, sneezing, sore throat, tinnitus and trouble swallowing.   Eyes:  Negative for pain, discharge, redness, itching and visual disturbance.  Respiratory:  Negative for cough, chest tightness, shortness of breath and wheezing.   Cardiovascular:  Negative for chest pain, palpitations and leg  swelling.  Gastrointestinal:  Negative for abdominal distention, abdominal pain, blood in stool, constipation, diarrhea, nausea and vomiting.  Endocrine: Negative for cold intolerance, heat intolerance, polydipsia, polyphagia and polyuria.  Genitourinary:  Negative for difficulty urinating, dysuria, flank pain, frequency and urgency.  Musculoskeletal:  Positive for arthralgias. Negative for back pain, gait problem, joint swelling, myalgias, neck pain and neck stiffness.       Left thigh and knee pain   Skin:  Negative for color change, pallor, rash and wound.  Neurological:  Negative for dizziness, syncope, speech difficulty, weakness,  light-headedness, numbness and headaches.  Hematological:  Does not bruise/bleed easily.  Psychiatric/Behavioral:  Negative for agitation, behavioral problems, confusion, hallucinations, self-injury, sleep disturbance and suicidal ideas. The patient is not nervous/anxious.     Immunization History  Administered Date(s) Administered   Influenza,inj,Quad PF,6+ Mos 08/28/2019   Influenza-Unspecified 08/02/2020   PFIZER(Purple Top)SARS-COV-2 Vaccination 01/06/2020, 01/30/2020, 08/18/2020   Rabies, IM 12/07/2019, 12/10/2019, 12/14/2019, 12/21/2019   Pertinent  Health Maintenance Due  Topic Date Due   INFLUENZA VACCINE  Completed   DEXA SCAN  Completed      12/07/2019   12:59 PM 12/10/2019   10:12 AM 12/14/2019    9:11 AM 01/25/2023   10:12 AM  Fall Risk  Falls in the past year?    1  Was there an injury with Fall?    1  Fall Risk Category Calculator    2  (RETIRED) Patient Fall Risk Level Low fall risk Low fall risk Low fall risk   Patient at Risk for Falls Due to    History of fall(s)  Fall risk Follow up    Falls evaluation completed;Education provided;Falls prevention discussed   Functional Status Survey:    Vitals:   01/25/23 1006 01/25/23 1024  BP: (!) 140/90 138/88  Pulse: 70   Resp: 14   Temp: (!) 97.5 F (36.4 C)   TempSrc: Temporal   SpO2: 98%   Weight: 132 lb (59.9 kg)   Height: 5' 1.42" (1.56 m)    Body mass index is 24.6 kg/m. Physical Exam Vitals reviewed.  Constitutional:      General: She is not in acute distress.    Appearance: Normal appearance. She is normal weight. She is not ill-appearing or diaphoretic.  HENT:     Head: Normocephalic.     Right Ear: Tympanic membrane, ear canal and external ear normal. There is no impacted cerumen.     Left Ear: Tympanic membrane, ear canal and external ear normal. There is no impacted cerumen.     Nose: Nose normal. No congestion or rhinorrhea.     Mouth/Throat:     Mouth: Mucous membranes are moist.      Pharynx: Oropharynx is clear. No oropharyngeal exudate or posterior oropharyngeal erythema.  Eyes:     General: No scleral icterus.       Right eye: No discharge.        Left eye: No discharge.     Extraocular Movements: Extraocular movements intact.     Conjunctiva/sclera: Conjunctivae normal.     Pupils: Pupils are equal, round, and reactive to light.  Neck:     Vascular: No carotid bruit.  Cardiovascular:     Rate and Rhythm: Normal rate and regular rhythm.     Pulses: Normal pulses.     Heart sounds: Normal heart sounds. No murmur heard.    No friction rub. No gallop.  Pulmonary:     Effort: Pulmonary effort is normal.  No respiratory distress.     Breath sounds: Normal breath sounds. No wheezing, rhonchi or rales.  Chest:     Chest wall: No tenderness.  Abdominal:     General: Bowel sounds are normal. There is no distension.     Palpations: Abdomen is soft. There is no mass.     Tenderness: There is no abdominal tenderness. There is no right CVA tenderness, left CVA tenderness, guarding or rebound.  Musculoskeletal:        General: No swelling. Normal range of motion.     Cervical back: Normal range of motion. No rigidity or tenderness.     Right knee: Normal.     Left knee: No swelling, effusion, erythema or crepitus. Normal range of motion. Tenderness present.     Right lower leg: No edema.     Left lower leg: No edema.  Lymphadenopathy:     Cervical: No cervical adenopathy.  Skin:    General: Skin is warm and dry.     Coloration: Skin is not pale.     Findings: No bruising, erythema, lesion or rash.  Neurological:     Mental Status: She is alert and oriented to person, place, and time.     Cranial Nerves: No cranial nerve deficit.     Sensory: No sensory deficit.     Motor: No weakness.     Coordination: Coordination normal.     Gait: Gait normal.  Psychiatric:        Mood and Affect: Mood normal.        Speech: Speech normal.        Behavior: Behavior normal.         Thought Content: Thought content normal.        Judgment: Judgment normal.     Labs reviewed: No results for input(s): "NA", "K", "CL", "CO2", "GLUCOSE", "BUN", "CREATININE", "CALCIUM", "MG", "PHOS" in the last 8760 hours. No results for input(s): "AST", "ALT", "ALKPHOS", "BILITOT", "PROT", "ALBUMIN" in the last 8760 hours. No results for input(s): "WBC", "NEUTROABS", "HGB", "HCT", "MCV", "PLT" in the last 8760 hours. No results found for: "TSH" No results found for: "HGBA1C" No results found for: "CHOL", "HDL", "LDLCALC", "LDLDIRECT", "TRIG", "CHOLHDL"  Significant Diagnostic Results in last 30 days:  No results found.  Assessment/Plan  1. Essential hypertension B/p elevated thinks due to being new place. Continue to monitor for now  - continue on losartan  - COMPLETE METABOLIC PANEL WITH GFR; Future - CBC with Differential/Platelet; Future  2. Acquired hypothyroidism No TSH for review  - continue on levothyroxine  - TSH; Future  3. Hyperlipidemia LDL goal <70 No recent LDL for review  - continue on atorvastatin  - Lipid panel; Future  4. Chronic pain of left knee Chronic but ha worsen  - will obtain imaging then refer to orthopedic  - continue current pain regimen  - DG Knee Complete 4 Views Left; Future  5. Left thigh pain Continue on current pain regimen  - DG FEMUR MIN 2 VIEWS LEFT; Future  6. Bilateral impacted cerumen TM not visualized  -  Instill debrox 6.5 otic solution 5 drops into each ear twice daily x 4 days then follow up for ear lavage.May apply cotton ball at bedtime to prevent drainage to pillow. - carbamide peroxide (DEBROX) 6.5 % OTIC solution; Place 5 drops into both ears 2 (two) times daily for 4 days.  Dispense: 2 mL; Refill: 0  Family/ staff Communication: Reviewed plan of care with patient verbalized  understanding  Labs/tests ordered: - TSH; Future - Lipid panel; Future - COMPLETE METABOLIC PANEL WITH GFR; Future - CBC with  Differential/Platelet; Future - DG Knee Complete 4 Views Left; Future - DG FEMUR MIN 2 VIEWS LEFT; Future  Next Appointment : Return in about 6 months (around 07/28/2023) for medical mangement of chronic issues., fasting labs in one week or sooner .   Sandrea Hughs, NP

## 2023-01-25 NOTE — Patient Instructions (Signed)
-   Instill debrox 6.5 otic solution 5 drops into each ear twice daily x 4 days then follow up for ear lavage.May apply cotton ball at bedtime to prevent drainage to pillow.  -Please get left thigh and knee X-ray at [facility] imaging at [facility] then will call you with results.

## 2023-01-27 ENCOUNTER — Ambulatory Visit
Admission: RE | Admit: 2023-01-27 | Discharge: 2023-01-27 | Disposition: A | Discharge status: Home / Self Care | Payer: Medicare Other | Source: Ambulatory Visit | Attending: Family | Admitting: Family

## 2023-01-27 DIAGNOSIS — M1712 Unilateral primary osteoarthritis, left knee: Secondary | ICD-10-CM | POA: Diagnosis not present

## 2023-01-27 DIAGNOSIS — G8929 Other chronic pain: Secondary | ICD-10-CM

## 2023-01-27 DIAGNOSIS — M1612 Unilateral primary osteoarthritis, left hip: Secondary | ICD-10-CM | POA: Diagnosis not present

## 2023-01-27 DIAGNOSIS — M79652 Pain in left thigh: Secondary | ICD-10-CM

## 2023-01-28 ENCOUNTER — Other Ambulatory Visit: Payer: Medicare Other

## 2023-01-28 DIAGNOSIS — E039 Hypothyroidism, unspecified: Secondary | ICD-10-CM | POA: Diagnosis not present

## 2023-01-28 DIAGNOSIS — I10 Essential (primary) hypertension: Secondary | ICD-10-CM | POA: Diagnosis not present

## 2023-01-28 DIAGNOSIS — E785 Hyperlipidemia, unspecified: Secondary | ICD-10-CM

## 2023-01-29 LAB — COMPLETE METABOLIC PANEL WITH GFR
AG Ratio: 1.8 (calc) (ref 1.0–2.5)
ALT: 21 U/L (ref 6–29)
AST: 24 U/L (ref 10–35)
Albumin: 4.6 g/dL (ref 3.6–5.1)
Alkaline phosphatase (APISO): 52 U/L (ref 37–153)
BUN: 9 mg/dL (ref 7–25)
CO2: 27 mmol/L (ref 20–32)
Calcium: 10.2 mg/dL (ref 8.6–10.4)
Chloride: 98 mmol/L (ref 98–110)
Creat: 0.7 mg/dL (ref 0.60–1.00)
Globulin: 2.5 g/dL (calc) (ref 1.9–3.7)
Glucose, Bld: 98 mg/dL (ref 65–99)
Potassium: 4.5 mmol/L (ref 3.5–5.3)
Sodium: 135 mmol/L (ref 135–146)
Total Bilirubin: 0.5 mg/dL (ref 0.2–1.2)
Total Protein: 7.1 g/dL (ref 6.1–8.1)
eGFR: 90 mL/min/{1.73_m2} (ref >=60)

## 2023-01-29 LAB — CBC WITH DIFFERENTIAL/PLATELET
Absolute Monocytes: 471 cells/uL (ref 200–950)
Basophils Absolute: 62 cells/uL (ref 0–200)
Basophils Relative: 1.4 %
Eosinophils Absolute: 119 cells/uL (ref 15–500)
Eosinophils Relative: 2.7 %
HCT: 37.9 % (ref 35.0–45.0)
Hemoglobin: 12.6 g/dL (ref 11.7–15.5)
Lymphs Abs: 1338 cells/uL (ref 850–3900)
MCH: 29.6 pg (ref 27.0–33.0)
MCHC: 33.2 g/dL (ref 32.0–36.0)
MCV: 89 fL (ref 80.0–100.0)
MPV: 10.2 fL (ref 7.5–12.5)
Monocytes Relative: 10.7 %
Neutro Abs: 2411 cells/uL (ref 1500–7800)
Neutrophils Relative %: 54.8 %
Platelets: 369 10*3/uL (ref 140–400)
RBC: 4.26 10*6/uL (ref 3.80–5.10)
RDW: 12.7 % (ref 11.0–15.0)
Total Lymphocyte: 30.4 %
WBC: 4.4 10*3/uL (ref 3.8–10.8)

## 2023-01-29 LAB — LIPID PANEL
Cholesterol: 200 mg/dL — ABNORMAL HIGH (ref <200)
HDL: 114 mg/dL (ref >=50)
LDL Cholesterol (Calc): 72 mg/dL (calc)
Non-HDL Cholesterol (Calc): 86 mg/dL (calc) (ref <130)
Total CHOL/HDL Ratio: 1.8 (calc) (ref <5.0)
Triglycerides: 65 mg/dL (ref <150)

## 2023-01-29 LAB — TSH: TSH: 3.83 mIU/L (ref 0.40–4.50)

## 2023-01-30 DIAGNOSIS — E039 Hypothyroidism, unspecified: Secondary | ICD-10-CM | POA: Insufficient documentation

## 2023-01-30 DIAGNOSIS — G8929 Other chronic pain: Secondary | ICD-10-CM | POA: Insufficient documentation

## 2023-01-30 DIAGNOSIS — E785 Hyperlipidemia, unspecified: Secondary | ICD-10-CM | POA: Insufficient documentation

## 2023-01-30 DIAGNOSIS — I10 Essential (primary) hypertension: Secondary | ICD-10-CM | POA: Insufficient documentation

## 2023-01-31 ENCOUNTER — Other Ambulatory Visit: Payer: Self-pay | Admitting: Family

## 2023-01-31 DIAGNOSIS — M79652 Pain in left thigh: Secondary | ICD-10-CM

## 2023-01-31 DIAGNOSIS — G8929 Other chronic pain: Secondary | ICD-10-CM

## 2023-02-16 ENCOUNTER — Encounter: Payer: Self-pay | Admitting: Sports Medicine

## 2023-02-16 ENCOUNTER — Ambulatory Visit (INDEPENDENT_AMBULATORY_CARE_PROVIDER_SITE_OTHER): Payer: Medicare Other | Admitting: Sports Medicine

## 2023-02-16 ENCOUNTER — Other Ambulatory Visit: Payer: Self-pay

## 2023-02-16 DIAGNOSIS — R29898 Other symptoms and signs involving the musculoskeletal system: Secondary | ICD-10-CM | POA: Diagnosis not present

## 2023-02-16 DIAGNOSIS — M1612 Unilateral primary osteoarthritis, left hip: Secondary | ICD-10-CM | POA: Diagnosis not present

## 2023-02-16 DIAGNOSIS — M25552 Pain in left hip: Secondary | ICD-10-CM | POA: Diagnosis not present

## 2023-02-16 DIAGNOSIS — M25562 Pain in left knee: Secondary | ICD-10-CM

## 2023-02-16 MED ORDER — LIDOCAINE HCL 1 % IJ SOLN
4.0000 mL | INTRAMUSCULAR | Status: AC | PRN
Start: 2023-02-16 — End: 2023-02-16
  Administered 2023-02-16: 4 mL

## 2023-02-16 MED ORDER — METHYLPREDNISOLONE ACETATE 40 MG/ML IJ SUSP
80.0000 mg | INTRAMUSCULAR | Status: AC | PRN
Start: 2023-02-16 — End: 2023-02-16
  Administered 2023-02-16: 80 mg via INTRA_ARTICULAR

## 2023-02-16 NOTE — Progress Notes (Signed)
Had a fall over a year ago; landed hard on her left side Complaining today of intermittent left hip/knee pain  Has tried PT, topical analgesics and advil

## 2023-02-16 NOTE — Progress Notes (Signed)
Valerie Reese - 77 y.o. female MRN [id number]  Date of birth: [birth year]  Office Visit Note: Visit Date: 02/16/2023 PCP: Sandrea Hughs, NP Referred by: Sandrea Hughs, NP  Subjective: Chief Complaint  Patient presents with   Left Hip - Pain   Left Knee - Pain   HPI: Valerie Reese is a pleasant 77 y.o. female who presents today for acute on chronic right hip pain and right knee pain.   Valerie Reese has had pain in the leg from the hip for about 1.5 years.  She states around that time her pain started when she had a fall onto the left hip and leg.  She had bruising over the lateral side of the leg, but after a few days to weeks this improved and got better.  However since this time she has had on and off pain over the leg that will radiate into the groin.  She denies any numbness or tingling.  At times she has pain over the anterior knee as well.  She is on turmeric and co-Q10.  She will take Advil very sparingly as needed for pain control.  She does wear a knee sleeve.  She is still remains active walking her dog nearly daily.  At times when her pain is bad she will limp noticeably because of the pain.  Had x-ray done by her PCP before referral here.  Has not been taking medicine or had any injections in the past.  She did do physical therapy from September to December 2023 which did help somewhat, but had to stop this due to the out-of-pocket cost.  Pertinent ROS were reviewed with the patient and found to be negative unless otherwise specified above in HPI.   Assessment & Plan: Visit Diagnoses:  1. Unilateral primary osteoarthritis, left hip   2. Pain in left hip   3. Patellofemoral arthralgia of left knee   4. Weakness of both hips    Plan: Discussed with [name] the nature of her right leg and hip pain, which shows severe osteoarthritis of the left hip.  She is experiencing an exacerbation of this chronic condition.  She also has pain in the knee, she has at least moderate  patellofemoral arthritis, although the remainder of her joint spaces of the knee is relatively well-preserved.  In terms of the knee, I would like her to continue her knee compression sleeve.  She may take Advil as needed or use topical medication.  We discussed options for the hip such as oral medication therapy, home therapy, formalized physical therapy, injection therapy, even total hip replacement.  She is not at a place where she wants to proceed with hip replacement, which I think is fair as she has not had much treatment yet.  She is interested in an injection into the hip, consider decision making proceed with ultrasound-guided hip injection which she tolerated well.  She may use ice as well as Advil as needed for any postinjection pain or pain going forward.  The cost of formalized physical therapy is too much at this time, we did print her a customized handout for the hips, my athletic trainer Lilia Pro did review these in the room with her today.  This is for strengthening and stabilization of the hip joint, as well as her weakness with her hip abduction is all contributing.  I would like to see her back in 1 month for reevaluation.  Follow-up: Return in about 1 month (around 03/18/2023) for left hip.  Meds & Orders: No orders of the defined types were placed in this encounter.   Orders Placed This Encounter  Procedures   Large Joint Inj   US Guided Needle Placement - No Linked Charges     Procedures: Large Joint Inj: L hip joint on 02/16/2023 11:01 AM Indications: pain Details: 22 G 3.5 in needle, ultrasound-guided anterior approach Medications: 4 mL lidocaine 1 %; 80 mg methylPREDNISolone acetate 40 MG/ML Outcome: tolerated well, no immediate complications  Procedure: US-guided intra-articular hip injection, left After discussion on risks/benefits/indications and informed verbal consent was obtained, a timeout was performed. Patient was lying supine on exam table. The hip was cleaned with  betadine and alcohol swabs. Then utilizing ultrasound guidance, the patient's femoral head and neck junction was identified and subsequently injected with 4:2 lidocaine:depomedrol via an in-plane approach with ultrasound visualization of the injectate administered into the hip joint. Patient tolerated procedure well without immediate complications.  Procedure, treatment alternatives, risks and benefits explained, specific risks discussed. Consent was given by the patient. Immediately prior to procedure a time out was called to verify the correct patient, procedure, equipment, support staff and site/side marked as required. Patient was prepped and draped in the usual sterile fashion.          Clinical History: No specialty comments available.  She reports that she quit smoking about 36 years ago. Her smoking use included cigarettes. She has a 20.00 pack-year smoking history. She has never been exposed to tobacco smoke. She has never used smokeless tobacco. No results for input(s): "HGBA1C", "LABURIC" in the last 8760 hours.  Objective:    Physical Exam  Gen: Well-appearing, in no acute distress; non-toxic CV: Well-perfused. Warm.  Resp: Breathing unlabored on room air; no wheezing. Psych: Fluid speech in conversation; appropriate affect; normal thought process Neuro: Sensation intact throughout. No gross coordination deficits.   Ortho Exam - Left hip: No specific bony TTP or greater trochanter TTP.  There is marked restriction with internal logroll, negative for external logroll.  Positive FADIR with associated pain, equivocal FABER testing.  There is pain at endrange flexion about the hip.  There is weakness with resisted hip abduction bilaterally 4/5.  Neurovascular intact distally.  - Left knee: Inspection of the knee shows no redness or swelling.  There is full range of motion from 0-135 degrees.  No varus or valgus instability.  There is patellar crepitus noted.  Strength is 5/5  throughout.  Neurovascular intact distally.  Imaging:  *Independent review of 2 views of the left femur from 01/27/2023 reviewed and interpreted by myself.  X-rays demonstrate severe osteoarthritic changes of the left hip with near complete loss of the joint space, subchondral sclerosis and osteophytes most notably inferiorly.  No bony abnormality of the cortex of the femur.   *Independent review of 4 view x-ray of the knee from 01/27/2023 was interpreted by myself.  X-rays demonstrate moderate to severe narrowing of the patellofemoral joint, the medial lateral joint space is relatively well-preserved without significant arthritic change.  No acute fracture.  DG FEMUR MIN 2 VIEWS LEFT CLINICAL DATA:  Left inner thigh pain.  No recent injury.  EXAM: LEFT FEMUR 2 VIEWS  COMPARISON:  Left knee radiographs-earlier same day  FINDINGS: No fracture or dislocation. Severe degenerative change of the left hip with joint space loss, superior migration of the femoral head in relation to the acetabulum, subchondral sclerosis and osteophytosis. No evidence of avascular necrosis.  Limited visualization of the knee suggests mild tricompartmental degenerative  change, better demonstrated on dedicated left knee radiographs performed earlier same day.  Regional soft tissues appear normal.  IMPRESSION: Severe degenerative change of the left hip. Further evaluation with dedicated left knee radiographs could be performed as indicated.  Electronically Signed   By: Sandi Mariscal M.D.   On: 01/30/2023 09:59 DG Knee Complete 4 Views Left CLINICAL DATA:  Left knee pain.  No recent injury.  EXAM: LEFT KNEE - COMPLETE 4+ VIEW  COMPARISON:  Left femur radiographs-earlier same day  FINDINGS: No fracture or dislocation. Mild degenerative change of the left knee with joint space loss, subchondral sclerosis and osteophytosis. No evidence of chondrocalcinosis. No joint effusion. Regional soft tissues appear  normal.  IMPRESSION: Mild degenerative change of the left knee. Mild tricompartmental degenerative change of the knee.  Electronically Signed   By: Sandi Mariscal M.D.   On: 01/30/2023 09:58    Past Medical/Family/Surgical/Social History: Medications & Allergies reviewed per EMR, new medications updated. Patient Active Problem List   Diagnosis Date Noted   Essential hypertension 01/30/2023   Acquired hypothyroidism 01/30/2023   Hyperlipidemia LDL goal <70 01/30/2023   Chronic pain of left knee 01/30/2023   Multiple lung nodules 05/06/2021   Past Medical History:  Diagnosis Date   Allergic rhinitis    Hypertension    Hypothyroid    age 59   Hypothyroidism    Family History  Problem Relation Age of Onset   Aneurysm Mother    Fibroids Father    Pulmonary disease Father    Hypertension Father    Hypertension Brother    Lung cancer Maternal Grandfather    Past Surgical History:  Procedure Laterality Date   ECTOPIC PREGNANCY SURGERY     Social History   Occupational History   Occupation: Retired  Tobacco Use   Smoking status: Former    Packs/day: 1.00    Years: 20.00    Additional pack years: 0.00    Total pack years: 20.00    Types: Cigarettes    Quit date: 1988    Years since quitting: 36.2    Passive exposure: Never   Smokeless tobacco: Never  Vaping Use   Vaping Use: Never used  Substance and Sexual Activity   Alcohol use: Yes    Comment: occasional    Drug use: Never   Sexual activity: Not on file

## 2023-03-09 ENCOUNTER — Other Ambulatory Visit: Payer: Self-pay

## 2023-03-09 MED ORDER — LEVOTHYROXINE SODIUM 112 MCG PO TABS: 112.0000 ug | ORAL_TABLET | Freq: Every day | ORAL | 1 refills | Status: DC

## 2023-03-09 MED ORDER — LOSARTAN POTASSIUM 25 MG PO TABS: 25.0000 mg | ORAL_TABLET | Freq: Every day | ORAL | 1 refills | Status: DC

## 2023-03-09 MED ORDER — ATORVASTATIN CALCIUM 10 MG PO TABS: 10.0000 mg | ORAL_TABLET | Freq: Every day | ORAL | 1 refills | Status: AC

## 2023-03-09 NOTE — Telephone Encounter (Signed)
Patient request refill on medication

## 2023-03-18 ENCOUNTER — Ambulatory Visit: Payer: Medicare Other | Admitting: Sports Medicine

## 2023-03-18 ENCOUNTER — Other Ambulatory Visit (INDEPENDENT_AMBULATORY_CARE_PROVIDER_SITE_OTHER): Payer: Medicare Other

## 2023-03-18 ENCOUNTER — Encounter: Payer: Self-pay | Admitting: Sports Medicine

## 2023-03-18 DIAGNOSIS — M25552 Pain in left hip: Secondary | ICD-10-CM

## 2023-03-18 DIAGNOSIS — M25562 Pain in left knee: Secondary | ICD-10-CM | POA: Diagnosis not present

## 2023-03-18 DIAGNOSIS — M1612 Unilateral primary osteoarthritis, left hip: Secondary | ICD-10-CM | POA: Diagnosis not present

## 2023-03-18 DIAGNOSIS — G8929 Other chronic pain: Secondary | ICD-10-CM

## 2023-03-18 MED ORDER — CELECOXIB 200 MG PO CAPS: ORAL_CAPSULE | ORAL | 1 refills | Status: DC

## 2023-03-18 NOTE — Progress Notes (Signed)
Valerie Reese - 77 y.o. female MRN [id number]  Date of birth: Mar 25, 1946  Office Visit Note: Visit Date: 03/18/2023 PCP: Caesar Bookman, NP Referred by: Caesar Bookman, NP  Subjective: Chief Complaint  Patient presents with   Left Hip - Pain   HPI: Valerie Reese is a pleasant 77 y.o. female who presents today for follow-up of left hip pain and knee pain.   Her left hip has severe osteoarthritis.  At last visit on 02/16/2023 we did proceed with ultrasound-guided intra-articular hip injection.  This gave her great relief but only for about 2-3 days and then her pain returned.  Did give her some home exercises with a handout for her to perform at home. She did do physical therapy from September to December 2023 which did help somewhat, but had to stop this due to the out-of-pocket cost.   Now having some right knee pain, but still able to perform all activities.  Wearing compression knee sleeve during the day which certainly helps.  Taking Advil as needed with moderate relief.  Pertinent ROS were reviewed with the patient and found to be negative unless otherwise specified above in HPI.   Assessment & Plan: Visit Diagnoses:  1. Unilateral primary osteoarthritis, left hip   2. Chronic left hip pain   3. Patellofemoral arthralgia of left knee    2 chronic, 1 with exac; Rx management  Plan: Discussed with Anglea that review of her hip x-rays do show severe osteoarthritis of the hip. Not interested in hip replacement, which I feel is reasonable. Her hip OA has been slightly exacerbated and unfortunately she only received temporary relief for a few days to up to 2 weeks of relief from the ultrasound-guided intra-articular hip injection.  She has been adherent to the home exercises, I would like her to continue these as she still has some weakness with resisted hip abduction.  Her patellofemoral arthralgia of the left knee is still present, but improved with some exercise and knee  compression sleeve.  We will start her on prescription Celebrex 200 mg to be taken once daily, she can take the p.m. dosing only as needed for breakthrough pain. Continue HEP. She will follow-up with me as needed. Handout for OA provided for patient today, see AVS.   Follow-up: Return if symptoms worsen or fail to improve.   Meds & Orders:  Meds ordered this encounter  Medications   DISCONTD: celecoxib (CELEBREX) 200 MG capsule    Sig: Take one tablet (200mg ) daily in AM. May take 2nd dose in PM only if needed.    Dispense:  60 capsule    Refill:  1   DISCONTD: celecoxib (CELEBREX) 200 MG capsule    Sig: Take one tablet (200mg ) daily in AM. May take 2nd dose in PM only if needed.    Dispense:  60 capsule    Refill:  1   celecoxib (CELEBREX) 200 MG capsule    Sig: Take one tablet (200mg ) daily in AM. May take 2nd dose in PM only if needed.    Dispense:  60 capsule    Refill:  1    Orders Placed This Encounter  Procedures   XR HIP UNILAT W OR W/O PELVIS 2-3 VIEWS LEFT     Procedures: No procedures performed      Clinical History: No specialty comments available.  She reports that she quit smoking about 36 years ago. Her smoking use included cigarettes. She has a 20.00 pack-year smoking  history. She has never been exposed to tobacco smoke. She has never used smokeless tobacco. No results for input(s): "HGBA1C", "LABURIC" in the last 8760 hours.  Objective:   Vital Signs: There were no vitals taken for this visit.  Physical Exam  Gen: Well-appearing, in no acute distress; non-toxic CV: Well-perfused. Warm.  Resp: Breathing unlabored on room air; no wheezing. Psych: Fluid speech in conversation; appropriate affect; normal thought process Neuro: Sensation intact throughout. No gross coordination deficits.   Ortho Exam - Left hip: There is marked restriction with internal logroll with a bony block and associated pain.  Positive FADIR test.  There is 4/5 weakness with resisted hip  abduction bilaterally, although left is worse than right.  No pain near the greater trochanteric area.  - Left knee: Full range of motion from 0-135 degrees.  No swelling or redness. NVI.  There is patellar crepitus with knee flexion and extension.  Imaging: XR HIP UNILAT W OR W/O PELVIS 2-3 VIEWS LEFT  Result Date: 03/18/2023 2 views of the left hip including AP and lateral film were ordered and reviewed by myself.  X-rays demonstrate severe osteoarthritis of the left hip with some flattening of the femoral head.  There is notable sclerosis within the acetabulum and the superior femoral head.  Osteophyte formation also present.  The right hip has minimal arthritis.   Past Medical/Family/Surgical/Social History: Medications & Allergies reviewed per EMR, new medications updated. Patient Active Problem List   Diagnosis Date Noted   Essential hypertension 01/30/2023   Acquired hypothyroidism 01/30/2023   Hyperlipidemia LDL goal <70 01/30/2023   Chronic pain of left knee 01/30/2023   Multiple lung nodules 05/06/2021   Past Medical History:  Diagnosis Date   Allergic rhinitis    Hypertension    Hypothyroid    age 77   Hypothyroidism    Family History  Problem Relation Age of Onset   Aneurysm Mother    Fibroids Father    Pulmonary disease Father    Hypertension Father    Hypertension Brother    Lung cancer Maternal Grandfather    Past Surgical History:  Procedure Laterality Date   ECTOPIC PREGNANCY SURGERY     Social History   Occupational History   Occupation: Retired  Tobacco Use   Smoking status: Former    Packs/day: 1.00    Years: 20.00    Additional pack years: 0.00    Total pack years: 20.00    Types: Cigarettes    Quit date: 1988    Years since quitting: 36.3    Passive exposure: Never   Smokeless tobacco: Never  Vaping Use   Vaping Use: Never used  Substance and Sexual Activity   Alcohol use: Yes    Comment: occasional    Drug use: Never   Sexual  activity: Not on file

## 2023-03-18 NOTE — Progress Notes (Signed)
Injection helped only for 2 days She is doing the HEP daily as well as a stationary bike. Takes 2 aleve daily which helps some

## 2023-03-18 NOTE — Patient Instructions (Signed)
Dr. Shon Baton' Guide for Management of Osteoarthritis:  1.)  Keep your body moving - keep working on flexibility and range of motion for the knee. Good physical activity exercise include: stationary bike, swimming, elliptical.  2.)  Maintain a healthy weight --> 1 pound of body weight = 4 pounds of weight on the knees.  Even small weight loss (5-10+ lbs) can significantly improve pain  3.)  Supplements helpful for arthritis and inflammation: Turmeric (curcumin) Boswellia serrata, collagen hydrolysate, Glucosamine-chondroitin 4.)  Foods helpful for arthritis and inflammation: Fish and foods containing omega-3's, leafy greens (broccoli, spinach), citrus fruits (grapefruit, oranges, lemons), green tea, blueberries, cherries, olive oil (EVO)  *It is very important to stay hydrated, drinking lots of water throughout the day. This helps hydrate structures within the knee.  5.) Medicines:    - Topical pain relievers: Voltaren gel, Arnica gel, Tiger balm, lidocaine/IcyHot patches    - Anti-inflammatories like Aleve, Motrin, ibuprofen --> we can consider prescription based NSAIDs as well if needed

## 2023-04-08 ENCOUNTER — Encounter: Payer: Self-pay | Admitting: Adult Health

## 2023-04-08 ENCOUNTER — Ambulatory Visit (INDEPENDENT_AMBULATORY_CARE_PROVIDER_SITE_OTHER): Payer: Medicare Other | Admitting: Adult Health

## 2023-04-08 VITALS — BP 121/87 | HR 85 | Temp 97.6°F | Resp 18 | Ht 61.0 in | Wt 129.2 lb

## 2023-04-08 DIAGNOSIS — R918 Other nonspecific abnormal finding of lung field: Secondary | ICD-10-CM

## 2023-04-08 DIAGNOSIS — I10 Essential (primary) hypertension: Secondary | ICD-10-CM

## 2023-04-08 DIAGNOSIS — E039 Hypothyroidism, unspecified: Secondary | ICD-10-CM | POA: Diagnosis not present

## 2023-04-08 DIAGNOSIS — R4189 Other symptoms and signs involving cognitive functions and awareness: Secondary | ICD-10-CM

## 2023-04-08 MED ORDER — DONEPEZIL HCL 5 MG PO TABS
5.0000 mg | ORAL_TABLET | Freq: Every day | ORAL | 3 refills | Status: DC
Start: 2023-04-08 — End: 2023-08-01

## 2023-04-08 NOTE — Progress Notes (Unsigned)
[facility] clinic  Provider:  Kenard Gower DNP  Code Status:  Full Code  Goals of Care:     04/08/2023    2:06 PM  Advanced Directives  Does Patient Have a Medical Advance Directive? No  Would patient like information on creating a medical advance directive? No - Patient declined     Chief Complaint  Patient presents with   Acute Visit    Patient has concerns about poor appetite, loss of weight, memory concerns and discuss aneurysm     HPI: Patient is a 77 y.o. female seen today for an acute visit for  Wt Readings from Last 3 Encounters:  04/08/23 129 lb 3.2 oz (58.6 kg)  01/25/23 132 lb (59.9 kg)  04/15/21 137 lb 9.6 oz (62.4 kg)      Past Medical History:  Diagnosis Date   Allergic rhinitis    Hypertension    Hypothyroid    age 78   Hypothyroidism     Past Surgical History:  Procedure Laterality Date   ECTOPIC PREGNANCY SURGERY      No Known Allergies  Outpatient Encounter Medications as of 04/08/2023  Medication Sig   atorvastatin (LIPITOR) 10 MG tablet Take 1 tablet (10 mg total) by mouth daily.   celecoxib (CELEBREX) 200 MG capsule Take one tablet (200mg ) daily in AM. May take 2nd dose in PM only if needed.   Cholecalciferol (VITAMIN D) 125 MCG (5000 UT) CAPS Take by mouth.   Coenzyme Q10 (CO Q 10 PO) Take by mouth.   Cyanocobalamin (B-12 PO) Take by mouth.   levothyroxine (SYNTHROID) 112 MCG tablet Take 1 tablet (112 mcg total) by mouth daily before breakfast.   losartan (COZAAR) 25 MG tablet Take 1 tablet (25 mg total) by mouth daily.   Menaquinone-7 (K2 PO) Take by mouth.   Multiple Vitamins-Minerals (EMERGEN-C IMMUNE PO) Take by mouth.   TURMERIC PO Take by mouth.   No facility-administered encounter medications on file as of 04/08/2023.    Review of Systems:  Review of Systems  Constitutional:  Negative for appetite change, chills, fatigue and fever.  HENT:  Negative for congestion, hearing loss, rhinorrhea and sore throat.   Eyes:  Negative.   Respiratory:  Negative for cough, shortness of breath and wheezing.   Cardiovascular:  Negative for chest pain, palpitations and leg swelling.  Gastrointestinal:  Negative for abdominal pain, constipation, diarrhea, nausea and vomiting.  Genitourinary:  Negative for dysuria.  Musculoskeletal:  Negative for arthralgias, back pain and myalgias.  Skin:  Negative for color change, rash and wound.  Neurological:  Negative for dizziness, weakness and headaches.  Psychiatric/Behavioral:  Negative for behavioral problems. The patient is not nervous/anxious.     Health Maintenance  Topic Date Due   DTaP/Tdap/Td (1 - Tdap) Never done   Medicare Annual Wellness (AWV)  02/18/2023   Zoster Vaccines- Shingrix (1 of 2) 07/28/2023 (Originally 08/16/1997)   INFLUENZA VACCINE  06/16/2023   Fecal DNA (Cologuard)  01/24/2025   Pneumonia Vaccine 44+ Years old  Completed   DEXA SCAN  Completed   COVID-19 Vaccine  Completed   Hepatitis C Screening  Completed   HPV VACCINES  Aged Out    Physical Exam: Vitals:   04/08/23 1420  BP: 121/87  Pulse: 85  Resp: 18  Temp: 97.6 F (36.4 C)  SpO2: 94%  Weight: 129 lb 3.2 oz (58.6 kg)  Height: 5\' 1"  (1.549 m)   Body mass index is 24.41 kg/m. Physical Exam Constitutional:  Appearance: Normal appearance.  HENT:     Head: Normocephalic and atraumatic.     Nose: Nose normal.     Mouth/Throat:     Mouth: Mucous membranes are moist.  Eyes:     Conjunctiva/sclera: Conjunctivae normal.  Cardiovascular:     Rate and Rhythm: Normal rate and regular rhythm.  Pulmonary:     Effort: Pulmonary effort is normal.     Breath sounds: Normal breath sounds.  Abdominal:     General: Bowel sounds are normal.     Palpations: Abdomen is soft.  Musculoskeletal:        General: Normal range of motion.     Cervical back: Normal range of motion.  Skin:    General: Skin is warm and dry.  Neurological:     General: No focal deficit present.     Mental  Status: She is alert and oriented to person, place, and time.  Psychiatric:        Mood and Affect: Mood normal.        Behavior: Behavior normal.        Thought Content: Thought content normal.        Judgment: Judgment normal.     Labs reviewed: Basic Metabolic Panel: Recent Labs    01/28/23 0810  NA 135  K 4.5  CL 98  CO2 27  GLUCOSE 98  BUN 9  CREATININE 0.70  CALCIUM 10.2  TSH 3.83   Liver Function Tests: Recent Labs    01/28/23 0810  AST 24  ALT 21  BILITOT 0.5  PROT 7.1   No results for input(s): "LIPASE", "AMYLASE" in the last 8760 hours. No results for input(s): "AMMONIA" in the last 8760 hours. CBC: Recent Labs    01/28/23 0810  WBC 4.4  NEUTROABS 2,411  HGB 12.6  HCT 37.9  MCV 89.0  PLT 369   Lipid Panel: Recent Labs    01/28/23 0810  CHOL 200*  HDL 114  LDLCALC 72  TRIG 65  CHOLHDL 1.8   No results found for: "HGBA1C"  Procedures since last visit: XR HIP UNILAT W OR W/O PELVIS 2-3 VIEWS LEFT  Result Date: 03/18/2023 2 views of the left hip including AP and lateral film were ordered and reviewed by myself.  X-rays demonstrate severe osteoarthritis of the left hip with some flattening of the femoral head.  There is notable sclerosis within the acetabulum and the superior femoral head.  Osteophyte formation also present.  The right hip has minimal arthritis.   Assessment/Plan      Labs/tests ordered:    Next appt:  07/26/2023

## 2023-04-08 NOTE — Patient Instructions (Signed)
Cognitive Tips  Keep a journal/notebook with sections for the following (or use sections separately as needed):  Calendar and appointment sheet, schedule for each day, lists of reminders (such as grocery lists or "to do" list), homework assignments for therapy, important information such as family and friends names / addresses / phone numbers, medications, medical history and doctors name / phone numbers.  Avoid / remove clutter and unnecessary items from areas such as countertops / cabinets in kitchen and bathroom, closets, etc.  Organize items by purpose.  Baskets and bins help with this.  Leave notes for reminders above task to be completed.  For example: Note to turn off stove over the the stove; note to lock door beside the door, not to brush teeth then wash face by sink, note to take medication on table etc.  To help recall names of people of people or things, mentally or verbally go through the alphabet to try to determine the 1st letter of the word as this may trigger the name or word you are looking for.  If this is too difficult and someone else knows the word, have them give you the first letter by asking, "does it start with an "A", "B", "C" etc. (or have them give you the first sound of the word or some other clue).  Review family events, occasions, names, etc.  Pictures are a good way to trigger memory.  Have others correct you if you answer something incorrectly.  Have them speak slowly with a few words to give you time to process and respond.  Don't let others automatically problem-solve. (For example: don't let them automatically lay out clothes in the correct position, but hand it to you folded so that you can figure it out for yourself.)  However, if you need help with tasks, they should give you as little as they can so that you can be successful.  If appropriate and safe, they may allow you to make mistakes so that you can figure out how to correct the error.  (For example, they  may allow you to put your shoes on the wrong foot to see if you notice that is wrong).  If you struggle, they should give you a cue.  (Example: "Do your shoes feel right?"  "Do they look right?") 

## 2023-05-12 ENCOUNTER — Ambulatory Visit
Admission: RE | Admit: 2023-05-12 | Discharge: 2023-05-12 | Disposition: A | Discharge status: Home / Self Care | Payer: Medicare Other | Source: Ambulatory Visit | Attending: Adult Health | Admitting: Adult Health

## 2023-05-12 DIAGNOSIS — R918 Other nonspecific abnormal finding of lung field: Secondary | ICD-10-CM

## 2023-05-12 DIAGNOSIS — R911 Solitary pulmonary nodule: Secondary | ICD-10-CM | POA: Diagnosis not present

## 2023-05-20 ENCOUNTER — Encounter: Payer: Self-pay | Admitting: Family

## 2023-05-20 ENCOUNTER — Ambulatory Visit (INDEPENDENT_AMBULATORY_CARE_PROVIDER_SITE_OTHER): Payer: Medicare Other | Admitting: Family

## 2023-05-20 VITALS — BP 138/80 | HR 68 | Temp 97.6°F | Ht 61.0 in | Wt 127.6 lb

## 2023-05-20 DIAGNOSIS — I7121 Aneurysm of the ascending aorta, without rupture: Secondary | ICD-10-CM

## 2023-05-20 DIAGNOSIS — M25552 Pain in left hip: Secondary | ICD-10-CM

## 2023-05-20 DIAGNOSIS — I700 Atherosclerosis of aorta: Secondary | ICD-10-CM | POA: Diagnosis not present

## 2023-05-20 DIAGNOSIS — R918 Other nonspecific abnormal finding of lung field: Secondary | ICD-10-CM | POA: Diagnosis not present

## 2023-05-20 DIAGNOSIS — R4189 Other symptoms and signs involving cognitive functions and awareness: Secondary | ICD-10-CM | POA: Diagnosis not present

## 2023-05-20 MED ORDER — ASPIRIN 81 MG PO TBEC
81.0000 mg | DELAYED_RELEASE_TABLET | Freq: Every day | ORAL | 12 refills | Status: DC
Start: 2023-05-20 — End: 2023-12-29

## 2023-05-20 NOTE — Progress Notes (Signed)
Provider: Richarda Blade FNP-C   Dinara Lupu, Donalee Citrin, NP  Patient Care Team: Susie Pousson, Donalee Citrin, NP as PCP - General (Family Medicine)  Extended Emergency Contact Information Primary Emergency Contact: [facility] Address: [address]          [place], [place] [address] [place] Mobile Phone: [phone] Relation: Brother  Code Status:  Full Code  Goals of care: Advanced Directive information    04/08/2023    2:06 PM  Advanced Directives  Does Patient Have a Medical Advance Directive? No  Would patient like information on creating a medical advance directive? No - Patient declined     Chief Complaint  Patient presents with   Medical Management of Chronic Issues    Patient presents today for a month follow-up and CT review    HPI:  Pt is a 77 y.o. female seen today for one month follow up and discuss lung CT scan results.she was seen by Yolanda Bonine NP on 04/08/2023 for poor appetite ,loss of weight ,memory loss concerns and aneurysm.she scored 30/30 on MMSE.She was started on Aricept 5 mg tablet daily. States lives by her self and cares for her dogs.she enjoys reading.  CT scan was ordered for follow up pulmonary nodules.CT scan was done 05/12/2023 which showed 8 mm left lower lobe pulmonary nodule stable over 2 years and compatible with a benign nodule.Also showed 4.1 cm ascending thoracic aortic aneurysm. Annual CTA or MRA imaging follow up recommended according to 2010 ACCF/AHA/AATS/ACR/ASA/SCA/SCAI/SIR/STS/SVM Guidelines for the Diagnosis and Management of Patients with Thoracic Aortic Disease. No acute cardiopulmonary disease.   Coronary artery disease and Aortic Atherosclerosis also noted on CT scan.Currently on Lipitor but not on ASA or anticoagulant.Her diet is vegetarian.Has not bee able to exercise or be active as previously due to left groin pain.   Has had additional 2 lbs weight loss since last visit.states usually eats x 3 small  meals.Discussed adding protein supplement or increasing protein in the diet such as protein dip with snacks.   Left hip/groin pain states has had to take Advil daily and has noticed pain seems to be worsening.she was seen by Orthopedic Dr.Brooks Annabelle Harman on 03/18/2023 and states  hip replacement was recommended but declined.states was afraid of her advance age and going through general anaesthesia.Also states lives alone with her dogs not sure who will take care of her dogs when she has surgery.does have a brother here in town but usually travels a lot with his family.she hopes to talk to him and arrange if he can assist caring for the dogs for her. She will call Dr.Brooks if she decide to have surgery.   Past Medical History:  Diagnosis Date   Allergic rhinitis    Hypertension    Hypothyroid    age 10   Hypothyroidism    Past Surgical History:  Procedure Laterality Date   ECTOPIC PREGNANCY SURGERY      No Known Allergies  Allergies as of 05/20/2023   No Known Allergies      Medication List        Accurate as of May 20, 2023 11:56 AM. If you have any questions, ask your nurse or doctor.          atorvastatin 10 MG tablet Commonly known as: LIPITOR Take 1 tablet (10 mg total) by mouth daily.   B-12 PO Take by mouth.   celecoxib 200 MG capsule Commonly known as: CeleBREX Take one tablet (200mg ) daily in AM. May take  2nd dose in PM only if needed.   CO Q 10 PO Take by mouth.   donepezil 5 MG tablet Commonly known as: ARICEPT Take 1 tablet (5 mg total) by mouth at bedtime.   EMERGEN-C IMMUNE PO Take by mouth.   K2 PO Take by mouth.   levothyroxine 112 MCG tablet Commonly known as: SYNTHROID Take 1 tablet (112 mcg total) by mouth daily before breakfast.   losartan 25 MG tablet Commonly known as: COZAAR Take 1 tablet (25 mg total) by mouth daily.   TURMERIC PO Take by mouth.   Vitamin D 125 MCG (5000 UT) Caps Take by mouth.        Review of Systems   Constitutional:  Negative for appetite change, chills, fatigue, fever and unexpected weight change.  HENT:  Negative for congestion, dental problem, ear discharge, ear pain, facial swelling, hearing loss, nosebleeds, postnasal drip, rhinorrhea, sinus pressure, sinus pain, sneezing, sore throat, tinnitus and trouble swallowing.   Eyes:  Negative for pain, discharge, redness, itching and visual disturbance.  Respiratory:  Negative for cough, chest tightness, shortness of breath and wheezing.   Cardiovascular:  Negative for chest pain, palpitations and leg swelling.  Gastrointestinal:  Negative for abdominal distention, abdominal pain, blood in stool, constipation, diarrhea, nausea and vomiting.  Endocrine: Negative for cold intolerance, heat intolerance, polydipsia, polyphagia and polyuria.  Genitourinary:  Negative for difficulty urinating, dysuria, flank pain, frequency and urgency.  Musculoskeletal:  Positive for arthralgias and gait problem. Negative for back pain, joint swelling, myalgias, neck pain and neck stiffness.       Left hip pain   Skin:  Negative for color change, pallor, rash and wound.  Neurological:  Negative for dizziness, syncope, speech difficulty, weakness, light-headedness, numbness and headaches.  Hematological:  Does not bruise/bleed easily.  Psychiatric/Behavioral:  Negative for agitation, behavioral problems, confusion, hallucinations and sleep disturbance. The patient is not nervous/anxious.        Memory loss     Immunization History  Administered Date(s) Administered   COVID-19, mRNA, vaccine(Comirnaty)12 years and older 08/06/2022   Influenza,inj,Quad PF,6+ Mos 08/28/2019   Influenza-Unspecified 08/02/2020, 07/29/2022   PFIZER(Purple Top)SARS-COV-2 Vaccination 01/06/2020, 01/30/2020, 08/18/2020, 03/19/2021, 07/30/2021   PNEUMOCOCCAL CONJUGATE-20 01/25/2023   Rabies, IM 12/07/2019, 12/10/2019, 12/14/2019, 12/21/2019   Pertinent  Health Maintenance Due  Topic  Date Due   INFLUENZA VACCINE  06/16/2023   DEXA SCAN  Completed      12/10/2019   10:12 AM 12/14/2019    9:11 AM 01/25/2023   10:12 AM 04/08/2023    2:17 PM 05/20/2023   11:06 AM  Fall Risk  Falls in the past year?   1 0 0  Was there an injury with Fall?   1 0 0  Fall Risk Category Calculator   2 0 0  (RETIRED) Patient Fall Risk Level Low fall risk Low fall risk     Patient at Risk for Falls Due to   History of fall(s) History of fall(s) No Fall Risks  Fall risk Follow up   Falls evaluation completed;Education provided;Falls prevention discussed Falls evaluation completed Falls evaluation completed   Functional Status Survey:    Vitals:   05/20/23 1101  BP: (!) 156/86  Pulse: 68  Temp: 97.6 F (36.4 C)  SpO2: 98%  Weight: 137 lb 9.6 oz (62.4 kg)  Height: 5\' 1"  (1.549 m)   Body mass index is 26 kg/m. Physical Exam Vitals reviewed.  Constitutional:      General: She is not  in acute distress.    Appearance: Normal appearance. She is overweight. She is not ill-appearing or diaphoretic.  HENT:     Head: Normocephalic.     Right Ear: Tympanic membrane, ear canal and external ear normal. There is no impacted cerumen.     Left Ear: Tympanic membrane, ear canal and external ear normal. There is no impacted cerumen.     Nose: Nose normal. No congestion or rhinorrhea.     Mouth/Throat:     Mouth: Mucous membranes are moist.     Pharynx: Oropharynx is clear. No oropharyngeal exudate or posterior oropharyngeal erythema.  Eyes:     General: No scleral icterus.       Right eye: No discharge.        Left eye: No discharge.     Extraocular Movements: Extraocular movements intact.     Conjunctiva/sclera: Conjunctivae normal.     Pupils: Pupils are equal, round, and reactive to light.  Neck:     Vascular: No carotid bruit.  Cardiovascular:     Rate and Rhythm: Normal rate and regular rhythm.     Pulses: Normal pulses.     Heart sounds: Normal heart sounds. No murmur heard.    No  friction rub. No gallop.  Pulmonary:     Effort: Pulmonary effort is normal. No respiratory distress.     Breath sounds: Normal breath sounds. No wheezing, rhonchi or rales.  Chest:     Chest wall: No tenderness.  Abdominal:     General: Bowel sounds are normal. There is no distension.     Palpations: Abdomen is soft. There is no mass.     Tenderness: There is no abdominal tenderness. There is no right CVA tenderness, left CVA tenderness, guarding or rebound.  Musculoskeletal:        General: No swelling or tenderness. Normal range of motion.     Cervical back: Normal range of motion. No rigidity or tenderness.     Right lower leg: No edema.     Left lower leg: No edema.  Lymphadenopathy:     Cervical: No cervical adenopathy.  Skin:    General: Skin is warm and dry.     Coloration: Skin is not pale.     Findings: No bruising, erythema, lesion or rash.  Neurological:     Mental Status: She is alert and oriented to person, place, and time.     Cranial Nerves: No cranial nerve deficit.     Sensory: No sensory deficit.     Motor: No weakness.     Coordination: Coordination normal.     Gait: Gait normal.  Psychiatric:        Mood and Affect: Mood normal.        Speech: Speech normal.        Behavior: Behavior normal.        Thought Content: Thought content normal.        Judgment: Judgment normal.     Labs reviewed: Recent Labs    01/28/23 0810  NA 135  K 4.5  CL 98  CO2 27  GLUCOSE 98  BUN 9  CREATININE 0.70  CALCIUM 10.2   Recent Labs    01/28/23 0810  AST 24  ALT 21  BILITOT 0.5  PROT 7.1   Recent Labs    01/28/23 0810  WBC 4.4  NEUTROABS 2,411  HGB 12.6  HCT 37.9  MCV 89.0  PLT 369   Lab Results  Component Value Date  TSH 3.83 01/28/2023   No results found for: "HGBA1C" Lab Results  Component Value Date   CHOL 200 (H) 01/28/2023   HDL 114 01/28/2023   LDLCALC 72 01/28/2023   TRIG 65 01/28/2023   CHOLHDL 1.8 01/28/2023    Significant  Diagnostic Results in last 30 days:  CT Chest Wo Contrast  Result Date: 05/19/2023 CLINICAL DATA:  Follow-up lung nodule.  Former smoker. EXAM: CT CHEST WITHOUT CONTRAST TECHNIQUE: Multidetector CT imaging of the chest was performed following the standard protocol without IV contrast. RADIATION DOSE REDUCTION: This exam was performed according to the departmental dose-optimization program which includes automated exposure control, adjustment of the mA and/or kV according to patient size and/or use of iterative reconstruction technique. COMPARISON:  03/25/2022, 02/10/2021 FINDINGS: Cardiovascular: 4.1 cm ascending thoracic aortic aneurysm is stable. Heart is normal size. Coronary artery and aortic atherosclerosis. Mediastinum/Nodes: No mediastinal, hilar, or axillary adenopathy. Trachea and esophagus are unremarkable. Thyroid unremarkable. Lungs/Pleura: 8 mm nodule in the left lower lobe on image 109 compared to 9 mm previously and 1 cm on prior study from 02/10/2021. Stability over 2 years is compatible with a benign nodule. Linear scarring in the left lung base. No new or enlarging pulmonary nodules or confluent opacities. No effusions. Upper Abdomen: No acute findings Musculoskeletal: Chest wall soft tissues are unremarkable. No acute bony abnormality. IMPRESSION: 8 mm left lower lobe pulmonary nodule has been stable over 2 years and is compatible with a benign nodule. 4.1 cm ascending thoracic aortic aneurysm. Recommend annual imaging followup by CTA or MRA. This recommendation follows 2010 ACCF/AHA/AATS/ACR/ASA/SCA/SCAI/SIR/STS/SVM Guidelines for the Diagnosis and Management of Patients with Thoracic Aortic Disease. Circulation. 2010; 121: O962-X528. Aortic aneurysm NOS (ICD10-I71.9) No acute cardiopulmonary disease. Coronary artery disease. Aortic Atherosclerosis (ICD10-I70.0). Electronically Signed   By: Charlett Nose M.D.   On: 05/19/2023 07:52    Assessment/Plan 1. Aortic atherosclerosis (HCC) -Per  Recent chest CT scan done 05/19/2023 -Continue on atorvastatin and aspirin - aspirin EC 81 MG tablet; Take 1 tablet (81 mg total) by mouth daily. Swallow whole.  Dispense: 30 tablet; Refill: 12  2. Left hip pain Chronic with worsening pain  - follow up with Orthopedic as directed  - continue on OTC analgesic   3. Cognitive deficits Continue on Aricept  - Brain stimulating activities recommended   4. Pulmonary nodules Noted on recent Chest CT scan no changes compared to previous scan - no cough,wheezing or shortness of breath on exam - continue to monitor  -Continue to follow-up with pulmonary specialist  5. Aneurysm of ascending aorta without rupture (HCC) Noted on recent CT scan  Measuring 4.1 cm ascending thoracic aortic aneurysm. Annual CTA or MRA recommended. No changes in size compared to previous Chest CT scan done 07/22/2021 measured 4.1 cm.- continue to control high risk factors - recommend referral to vein and vascular but has been following with PCP for CT scan yearly for the past 5 yrs without any changes in size.would like to continue with yearly CT scan for now then refer if increased in size or symptomatic.   Labs/tests ordered: None   Next Appointment : Return if symptoms worsen or fail to improve.   Caesar Bookman, NP

## 2023-05-22 NOTE — Progress Notes (Signed)
-    8 mm left lower lobe pulmonary nodule stable for 2 years and compatible with benign nodule. -  noted to have 4.1 cm ascending thoracic aortic aneurysm. It is recommended to have annual imaging follow up by CTA or MRA.

## 2023-05-23 ENCOUNTER — Telehealth: Payer: Self-pay

## 2023-05-23 NOTE — Telephone Encounter (Signed)
Informed patient

## 2023-05-23 NOTE — Telephone Encounter (Signed)
Patient states that's she received a missed call from you , but was unable to answer. Patient states it was not a detailed message so she is unsure of the reason and is available  for a call back at anytime

## 2023-05-23 NOTE — Telephone Encounter (Signed)
Called back but patient did not pick up.Please notify provider when patient calls back.

## 2023-06-02 ENCOUNTER — Telehealth: Payer: Medicare Other | Admitting: *Deleted

## 2023-06-02 DIAGNOSIS — M25552 Pain in left hip: Secondary | ICD-10-CM

## 2023-06-02 NOTE — Telephone Encounter (Signed)
Cane ordered.please fax to DME or to be picked up if desired

## 2023-06-02 NOTE — Telephone Encounter (Signed)
Patient called requesting a Rx for a Cane, due to a limp, for Medicare to cover the cost.   Pended and sent to [facility] for approval.

## 2023-06-03 NOTE — Telephone Encounter (Signed)
Patient called in would like to come in today and pick up an Rx for a cane so she can go to a Medical Store of choice to pick one out please advise.Marland Kitchen

## 2023-06-03 NOTE — Telephone Encounter (Signed)
Patient picked up order.

## 2023-06-03 NOTE — Telephone Encounter (Signed)
Patient request to pick up to take to Medical Supply store.   Forwarded to [facility] to print and leave up front due to working remotely.

## 2023-06-08 DIAGNOSIS — M25552 Pain in left hip: Secondary | ICD-10-CM | POA: Diagnosis not present

## 2023-06-23 ENCOUNTER — Encounter: Payer: Self-pay | Admitting: Orthopaedic Surgery

## 2023-06-23 ENCOUNTER — Ambulatory Visit: Payer: Medicare Other | Admitting: Orthopaedic Surgery

## 2023-06-23 DIAGNOSIS — M1612 Unilateral primary osteoarthritis, left hip: Secondary | ICD-10-CM | POA: Diagnosis not present

## 2023-06-23 NOTE — Progress Notes (Signed)
Office Visit Note   Patient: Valerie Reese           Date of Birth: 1946-01-19           MRN: [id number] Visit Date: 06/23/2023              Requested by: Caesar Bookman, NP [address] [place],  [place] [address] PCP: Caesar Bookman, NP   Assessment & Plan: Visit Diagnoses:  1. Primary osteoarthritis of left hip     Plan: Impression is severe left hip degenerative joint disease secondary to Osteoarthritis.  Imaging shows bone on bone joint space narrowing.  At this point, conservative treatments fail to provide any significant relief and the pain is severely affecting ADLs and quality of life.  Based on treatment options, the patient has elected to move forward with a hip replacement.  We have discussed the surgical risks that include but are not limited to infection, DVT, leg length discrepancy, numbness, tingling, incomplete relief of pain.  Recovery and prognosis were also reviewed.    Valerie Reese will call the patient to confirm surgical date.  They would like to look into obtaining a home health aide for postop care.  Current anticoagulants: aspirin 81 mg daily Postop anticoagulation: Aspirin 81 mg Diabetic: No  Prior DVT/PE: No Tobacco use: No Clearances needed for surgery: [place] PCP Anticipate discharge dispo: home   Follow-Up Instructions: No follow-ups on file.   Orders:  No orders of the defined types were placed in this encounter.  No orders of the defined types were placed in this encounter.     Procedures: No procedures performed   Clinical Data: No additional findings.   Subjective: Chief Complaint  Patient presents with   Left Hip - Pain    HPI Valerie Reese is a very pleasant 77 year old female referral from Dr. Shon Baton for surgical consultation for left total hip replacement.  She has been seeing Dr. Shon Baton for the last month.  She underwent a cortisone injection in May which only helped for couple days.  She is using a cane.  Her brother is with her  today.  She has had significant difficulty with ADLs and quality of life.  Advil is no longer effective.  Celebrex was not effective either.  Review of Systems  Constitutional: Negative.   HENT: Negative.    Eyes: Negative.   Respiratory: Negative.    Cardiovascular: Negative.   Endocrine: Negative.   Musculoskeletal: Negative.   Neurological: Negative.   Hematological: Negative.   Psychiatric/Behavioral: Negative.    All other systems reviewed and are negative.    Objective: Vital Signs: There were no vitals taken for this visit.  Physical Exam Vitals and nursing note reviewed.  Constitutional:      Appearance: She is well-developed.  HENT:     Head: Atraumatic.     Nose: Nose normal.  Eyes:     Extraocular Movements: Extraocular movements intact.  Cardiovascular:     Pulses: Normal pulses.  Pulmonary:     Effort: Pulmonary effort is normal.  Abdominal:     Palpations: Abdomen is soft.  Musculoskeletal:     Cervical back: Neck supple.  Skin:    General: Skin is warm.     Capillary Refill: Capillary refill takes less than 2 seconds.  Neurological:     Mental Status: She is alert. Mental status is at baseline.  Psychiatric:        Behavior: Behavior normal.  Thought Content: Thought content normal.        Judgment: Judgment normal.     Ortho Exam Examination of the left hip shows pain with any movement of the hip joint.  Antalgic gait. Specialty Comments:  No specialty comments available.  Imaging: No results found.   PMFS History: Patient Active Problem List   Diagnosis Date Noted   Essential hypertension 01/30/2023   Acquired hypothyroidism 01/30/2023   Hyperlipidemia LDL goal <70 01/30/2023   Chronic pain of left knee 01/30/2023   Multiple lung nodules 05/06/2021   Past Medical History:  Diagnosis Date   Allergic rhinitis    Hypertension    Hypothyroid    age 12   Hypothyroidism     Family History  Problem Relation Age of Onset    Aneurysm Mother    Fibroids Father    Pulmonary disease Father    Hypertension Father    Hypertension Brother    Lung cancer Maternal Grandfather     Past Surgical History:  Procedure Laterality Date   ECTOPIC PREGNANCY SURGERY     Social History   Occupational History   Occupation: Retired  Tobacco Use   Smoking status: Former    Current packs/day: 0.00    Average packs/day: 1 pack/day for 20.0 years (20.0 ttl pk-yrs)    Types: Cigarettes    Start date: 1968    Quit date: 1988    Years since quitting: 36.6    Passive exposure: Never   Smokeless tobacco: Never  Vaping Use   Vaping status: Never Used  Substance and Sexual Activity   Alcohol use: Yes    Comment: occasional    Drug use: Never   Sexual activity: Not on file

## 2023-07-05 ENCOUNTER — Telehealth: Payer: Self-pay | Admitting: Orthopaedic Surgery

## 2023-07-05 NOTE — Telephone Encounter (Signed)
Left message on patient's voice mail to return call to discuss left total hip arthroplasty with Dr. Roda Shutters.  Name and direct phone number provided.

## 2023-07-25 ENCOUNTER — Telehealth: Payer: Self-pay | Admitting: Orthopaedic Surgery

## 2023-07-25 MED ORDER — TRAMADOL HCL 50 MG PO TABS: 50.0000 mg | ORAL_TABLET | Freq: Every day | ORAL | 0 refills | Status: DC | PRN

## 2023-07-25 NOTE — Telephone Encounter (Signed)
I sent tramadol

## 2023-07-25 NOTE — Telephone Encounter (Signed)
Patient called advised she is having a problem walking. Patient said she is in a lot of pain and wanted to know if she can increase her dosage of Advil which she said she is taking 3 tabs 300 mg  a day.  Patient said she lives alone and it's hard to even walk to take her trash out. Patient asked if there is something else Dr. Roda Shutters can prescribe for her that is not real strong. The number to contact patient is [phone]

## 2023-07-26 ENCOUNTER — Other Ambulatory Visit: Payer: Medicare Other

## 2023-07-26 ENCOUNTER — Telehealth: Payer: Self-pay | Admitting: Orthopaedic Surgery

## 2023-07-26 NOTE — Telephone Encounter (Signed)
Yes she can take a stronger dose of Advil and alternate with tramadol.  Advil 800 mg 3 times a day.

## 2023-07-26 NOTE — Telephone Encounter (Signed)
Pt called stating she is in a lot of pain. She stated she has surgery in 7 weeks needs some relief during meantime. She stated tramadol was prescribed only 5 days. Pt wonder if she can take a stronger dose of Advil afterwards. Pt advised to give her a call please.  Elmo Putt [phone]

## 2023-07-26 NOTE — Telephone Encounter (Signed)
Called and notified patient via voicemail.  

## 2023-07-27 NOTE — Telephone Encounter (Signed)
Tried calling patient. No answer. Left detailed voicemail.

## 2023-07-29 ENCOUNTER — Ambulatory Visit: Payer: Medicare Other | Admitting: Family

## 2023-07-30 ENCOUNTER — Other Ambulatory Visit: Payer: Self-pay | Admitting: Adult Health

## 2023-07-30 DIAGNOSIS — R4189 Other symptoms and signs involving cognitive functions and awareness: Secondary | ICD-10-CM

## 2023-08-08 ENCOUNTER — Ambulatory Visit (INDEPENDENT_AMBULATORY_CARE_PROVIDER_SITE_OTHER): Payer: Medicare Other | Admitting: Adult Health

## 2023-08-08 ENCOUNTER — Encounter: Payer: Self-pay | Admitting: Adult Health

## 2023-08-08 VITALS — BP 118/78 | HR 68 | Temp 97.7°F | Resp 18 | Ht 61.0 in | Wt 128.2 lb

## 2023-08-08 DIAGNOSIS — I10 Essential (primary) hypertension: Secondary | ICD-10-CM | POA: Diagnosis not present

## 2023-08-08 DIAGNOSIS — R4189 Other symptoms and signs involving cognitive functions and awareness: Secondary | ICD-10-CM | POA: Diagnosis not present

## 2023-08-08 DIAGNOSIS — H6122 Impacted cerumen, left ear: Secondary | ICD-10-CM | POA: Diagnosis not present

## 2023-08-08 DIAGNOSIS — H9192 Unspecified hearing loss, left ear: Secondary | ICD-10-CM

## 2023-08-08 DIAGNOSIS — M25552 Pain in left hip: Secondary | ICD-10-CM

## 2023-08-08 NOTE — Progress Notes (Signed)
[facility] clinic  Provider: Kenard Gower DNP  Code Status:  Full Code  Goals of Care:     04/08/2023    2:06 PM  Advanced Directives  Does Patient Have a Medical Advance Directive? No  Would patient like information on creating a medical advance directive? No - Patient declined     Chief Complaint  Patient presents with   Acute Visit     acute Left ear totally clogged    HPI: Patient is a 77 y.o. female seen today for an acute visit for regarding clogged left ear.  Patient reported using over the counter ear drops to unclog her left ear. She stated that she felt nauseous and light headed 2 days ago. She is requesting left ear lavage.   Ear lavage done and obtained cerumen. She reported feeling ears still clogged. Discussed that she start debrox otic drops to soften cerumen. She declined and wanted to be referred to an ENT.  Past Medical History:  Diagnosis Date   Allergic rhinitis    Hypertension    Hypothyroid    age 44   Hypothyroidism     Past Surgical History:  Procedure Laterality Date   ECTOPIC PREGNANCY SURGERY      No Known Allergies  Outpatient Encounter Medications as of 08/08/2023  Medication Sig   aspirin EC 81 MG tablet Take 1 tablet (81 mg total) by mouth daily. Swallow whole.   atorvastatin (LIPITOR) 10 MG tablet Take 1 tablet (10 mg total) by mouth daily.   celecoxib (CELEBREX) 200 MG capsule Take one tablet (200mg ) daily in AM. May take 2nd dose in PM only if needed.   Cholecalciferol (VITAMIN D) 125 MCG (5000 UT) CAPS Take by mouth.   Coenzyme Q10 (CO Q 10 PO) Take by mouth.   Cyanocobalamin (B-12 PO) Take by mouth.   donepezil (ARICEPT) 5 MG tablet TAKE 1 TABLET BY MOUTH AT BEDTIME   levothyroxine (SYNTHROID) 112 MCG tablet Take 1 tablet (112 mcg total) by mouth daily before breakfast.   losartan (COZAAR) 25 MG tablet Take 1 tablet (25 mg total) by mouth daily.   Menaquinone-7 (K2 PO) Take by mouth.   Multiple Vitamins-Minerals (EMERGEN-C  IMMUNE PO) Take by mouth.   traMADol (ULTRAM) 50 MG tablet Take 1-2 tablets (50-100 mg total) by mouth daily as needed.   TURMERIC PO Take by mouth.   No facility-administered encounter medications on file as of 08/08/2023.    Review of Systems:  Review of Systems  Constitutional:  Negative for appetite change, chills, fatigue and fever.  HENT:  Negative for congestion, ear discharge, hearing loss, rhinorrhea and sore throat.        Left ear feels full   Eyes: Negative.   Respiratory:  Negative for cough, shortness of breath and wheezing.   Cardiovascular:  Negative for chest pain, palpitations and leg swelling.  Gastrointestinal:  Negative for abdominal pain, constipation, diarrhea, nausea and vomiting.  Genitourinary:  Negative for dysuria.  Musculoskeletal:  Negative for arthralgias, back pain and myalgias.  Skin:  Negative for color change, rash and wound.  Neurological:  Negative for dizziness, weakness and headaches.  Psychiatric/Behavioral:  Negative for behavioral problems. The patient is not nervous/anxious.     Health Maintenance  Topic Date Due   DTaP/Tdap/Td (1 - Tdap) Never done   Zoster Vaccines- Shingrix (1 of 2) Never done   Medicare Annual Wellness (AWV)  02/18/2023   INFLUENZA VACCINE  06/16/2023   COVID-19 Vaccine (7 - 2023-24  season) 07/17/2023   Fecal DNA (Cologuard)  01/24/2025   Pneumonia Vaccine 80+ Years old  Completed   DEXA SCAN  Completed   Hepatitis C Screening  Completed   HPV VACCINES  Aged Out    Physical Exam: Vitals:   08/08/23 1049  BP: 118/78  Pulse: 68  Resp: 18  Temp: 97.7 F (36.5 C)  SpO2: 99%  Weight: 128 lb 3.2 oz (58.2 kg)  Height: 5\' 1"  (1.549 m)   Body mass index is 24.22 kg/m. Physical Exam Constitutional:      Appearance: Normal appearance.  HENT:     Head: Normocephalic and atraumatic.     Left Ear: There is impacted cerumen.     Ears:     Comments: Left ear with moderate dry wax    Nose: Nose normal.      Mouth/Throat:     Mouth: Mucous membranes are moist.  Eyes:     Conjunctiva/sclera: Conjunctivae normal.  Cardiovascular:     Rate and Rhythm: Normal rate and regular rhythm.  Pulmonary:     Effort: Pulmonary effort is normal.     Breath sounds: Normal breath sounds.  Abdominal:     General: Bowel sounds are normal.     Palpations: Abdomen is soft.  Musculoskeletal:        General: Normal range of motion.     Cervical back: Normal range of motion.  Skin:    General: Skin is warm and dry.  Neurological:     General: No focal deficit present.     Mental Status: She is alert and oriented to person, place, and time.  Psychiatric:        Mood and Affect: Mood normal.        Behavior: Behavior normal.        Thought Content: Thought content normal.        Judgment: Judgment normal.     Labs reviewed: Basic Metabolic Panel: Recent Labs    01/28/23 0810  NA 135  K 4.5  CL 98  CO2 27  GLUCOSE 98  BUN 9  CREATININE 0.70  CALCIUM 10.2  TSH 3.83   Liver Function Tests: Recent Labs    01/28/23 0810  AST 24  ALT 21  BILITOT 0.5  PROT 7.1   No results for input(s): "LIPASE", "AMYLASE" in the last 8760 hours. No results for input(s): "AMMONIA" in the last 8760 hours. CBC: Recent Labs    01/28/23 0810  WBC 4.4  NEUTROABS 2,411  HGB 12.6  HCT 37.9  MCV 89.0  PLT 369   Lipid Panel: Recent Labs    01/28/23 0810  CHOL 200*  HDL 114  LDLCALC 72  TRIG 65  CHOLHDL 1.8   No results found for: "HGBA1C"  Procedures since last visit: No results found.  Assessment/Plan  1. Impacted cerumen of left ear -  left ear lavage done and was able to obtain moderate cerumen -  continues to have left ear feeling full -  declines to repeat Debrox otic drops -  requested to have  ENT referral -  Ambulatory referral to ENT  2. Cognitive deficits -  continue Aricept  3. Essential hypertension -  BP 118/78, stable -  continue Losartan     Labs/tests ordered:   None  Next appt:  Visit date not found

## 2023-08-09 DIAGNOSIS — R030 Elevated blood-pressure reading, without diagnosis of hypertension: Secondary | ICD-10-CM | POA: Diagnosis not present

## 2023-08-09 DIAGNOSIS — H6123 Impacted cerumen, bilateral: Secondary | ICD-10-CM | POA: Diagnosis not present

## 2023-08-17 ENCOUNTER — Telehealth: Payer: Self-pay | Admitting: Orthopaedic Surgery

## 2023-08-17 NOTE — Telephone Encounter (Signed)
Tried to call. No answer. Left voicemail for her to call back if she still has questions or concerns.

## 2023-08-17 NOTE — Telephone Encounter (Signed)
Pt called stating she has some medical questions before her surgery and would like a call from Margrett Rud, Dr Roda Shutters or PA Dub Mikes. Pt phone number is [phone].

## 2023-08-29 ENCOUNTER — Other Ambulatory Visit (INDEPENDENT_AMBULATORY_CARE_PROVIDER_SITE_OTHER): Payer: Medicare Other

## 2023-08-29 ENCOUNTER — Ambulatory Visit: Payer: Medicare Other

## 2023-08-29 DIAGNOSIS — M1612 Unilateral primary osteoarthritis, left hip: Secondary | ICD-10-CM

## 2023-09-05 DIAGNOSIS — E039 Hypothyroidism, unspecified: Secondary | ICD-10-CM | POA: Diagnosis not present

## 2023-09-05 DIAGNOSIS — E559 Vitamin D deficiency, unspecified: Secondary | ICD-10-CM | POA: Diagnosis not present

## 2023-09-05 DIAGNOSIS — I700 Atherosclerosis of aorta: Secondary | ICD-10-CM | POA: Diagnosis not present

## 2023-09-05 DIAGNOSIS — I4719 Other supraventricular tachycardia: Secondary | ICD-10-CM | POA: Diagnosis not present

## 2023-09-05 DIAGNOSIS — I712 Thoracic aortic aneurysm, without rupture, unspecified: Secondary | ICD-10-CM | POA: Diagnosis not present

## 2023-09-05 DIAGNOSIS — E7800 Pure hypercholesterolemia, unspecified: Secondary | ICD-10-CM | POA: Diagnosis not present

## 2023-09-05 DIAGNOSIS — I10 Essential (primary) hypertension: Secondary | ICD-10-CM | POA: Diagnosis not present

## 2023-09-05 DIAGNOSIS — E538 Deficiency of other specified B group vitamins: Secondary | ICD-10-CM | POA: Diagnosis not present

## 2023-09-08 NOTE — Pre-Procedure Instructions (Signed)
Surgical Instructions   Your procedure is scheduled on September 19, 2023. Report to [facility] Main Entrance "A" at 7:10 A.M., then check in with the Admitting office. Any questions or running late day of surgery: call [phone]  Questions prior to your surgery date: call [phone], Monday-Friday, 8am-4pm. If you experience any cold or flu symptoms such as cough, fever, chills, shortness of breath, etc. between now and your scheduled surgery, please notify us at the above number.     Remember:  Do not eat after midnight the night before your surgery  You may drink clear liquids until 6:40 AM the morning of your surgery.   Clear liquids allowed are: Water, Non-Citrus Juices (without pulp), Carbonated Beverages, Clear Tea, Black Coffee Only (NO MILK, CREAM OR POWDERED CREAMER of any kind), and Gatorade.  Patient Instructions  The night before surgery:  No food after midnight. ONLY clear liquids after midnight  The day of surgery (if you do NOT have diabetes):  Drink ONE (1) Pre-Surgery Clear Ensure by 6:40 AM the morning of surgery. Drink in one sitting. Do not sip.  This drink was given to you during your hospital  pre-op appointment visit.  Nothing else to drink after completing the  Pre-Surgery Clear Ensure.         If you have questions, please contact your surgeon's office.     Take these medicines the morning of surgery with A SIP OF WATER: atorvastatin (LIPITOR)  levothyroxine (SYNTHROID)    Follow your surgeon's instructions on when to stop Asprin.  If no instructions were given by your surgeon then you will need to call the office to get those instructions.     One week prior to surgery, STOP taking any Aleve, Naproxen, Ibuprofen, Motrin, Advil, Goody's, BC's, all herbal medications, fish oil, and non-prescription vitamins.                     Do NOT Smoke (Tobacco/Vaping) for 24 hours prior to your procedure.  If you use a CPAP at night, you may bring your  mask/headgear for your overnight stay.   You will be asked to remove any contacts, glasses, piercing's, hearing aid's, dentures/partials prior to surgery. Please bring cases for these items if needed.    Patients discharged the day of surgery will not be allowed to drive home, and someone needs to stay with them for 24 hours.  SURGICAL WAITING ROOM VISITATION Patients may have no more than 2 support people in the waiting area - these visitors may rotate.   Pre-op nurse will coordinate an appropriate time for 1 ADULT support person, who may not rotate, to accompany patient in pre-op.  Children under the age of 40 must have an adult with them who is not the patient and must remain in the main waiting area with an adult.  If the patient needs to stay at the hospital during part of their recovery, the visitor guidelines for inpatient rooms apply.  Please refer to the [facility] website for the visitor guidelines for any additional information.   If you received a COVID test during your pre-op visit  it is requested that you wear a mask when out in public, stay away from anyone that may not be feeling well and notify your surgeon if you develop symptoms. If you have been in contact with anyone that has tested positive in the last 10 days please notify you surgeon.      Pre-operative 5 CHG Bathing  Instructions   You can play a key role in reducing the risk of infection after surgery. Your skin needs to be as free of germs as possible. You can reduce the number of germs on your skin by washing with CHG (chlorhexidine gluconate) soap before surgery. CHG is an antiseptic soap that kills germs and continues to kill germs even after washing.   DO NOT use if you have an allergy to chlorhexidine/CHG or antibacterial soaps. If your skin becomes reddened or irritated, stop using the CHG and notify one of our RNs at [phone].   Please shower with the CHG soap starting 4 days before surgery using the  following schedule:     Please keep in mind the following:  DO NOT shave, including legs and underarms, starting the day of your first shower.   You may shave your face at any point before/day of surgery.  Place clean sheets on your bed the day you start using CHG soap. Use a clean washcloth (not used since being washed) for each shower. DO NOT sleep with pets once you start using the CHG.   CHG Shower Instructions:  Wash your face and private area with normal soap. If you choose to wash your hair, wash first with your normal shampoo.  After you use shampoo/soap, rinse your hair and body thoroughly to remove shampoo/soap residue.  Turn the water OFF and apply about 3 tablespoons (45 ml) of CHG soap to a CLEAN washcloth.  Apply CHG soap ONLY FROM YOUR NECK DOWN TO YOUR TOES (washing for 3-5 minutes)  DO NOT use CHG soap on face, private areas, open wounds, or sores.  Pay special attention to the area where your surgery is being performed.  If you are having back surgery, having someone wash your back for you may be helpful. Wait 2 minutes after CHG soap is applied, then you may rinse off the CHG soap.  Pat dry with a clean towel  Put on clean clothes/pajamas   If you choose to wear lotion, please use ONLY the CHG-compatible lotions on the back of this paper.   Additional instructions for the day of surgery: DO NOT APPLY any lotions, deodorants, cologne, or perfumes.   Do not bring valuables to the hospital. [facility] is not responsible for any belongings/valuables. Do not wear nail polish, gel polish, artificial nails, or any other type of covering on natural nails (fingers and toes) Do not wear jewelry or makeup Put on clean/comfortable clothes.  Please brush your teeth.  Ask your nurse before applying any prescription medications to the skin.     CHG Compatible Lotions   Aveeno Moisturizing lotion  Cetaphil Moisturizing Cream  Cetaphil Moisturizing Lotion  Clairol Herbal  Essence Moisturizing Lotion, Dry Skin  Clairol Herbal Essence Moisturizing Lotion, Extra Dry Skin  Clairol Herbal Essence Moisturizing Lotion, Normal Skin  Curel Age Defying Therapeutic Moisturizing Lotion with Alpha Hydroxy  Curel Extreme Care Body Lotion  Curel Soothing Hands Moisturizing Hand Lotion  Curel Therapeutic Moisturizing Cream, Fragrance-Free  Curel Therapeutic Moisturizing Lotion, Fragrance-Free  Curel Therapeutic Moisturizing Lotion, Original Formula  Eucerin Daily Replenishing Lotion  Eucerin Dry Skin Therapy Plus Alpha Hydroxy Crme  Eucerin Dry Skin Therapy Plus Alpha Hydroxy Lotion  Eucerin Original Crme  Eucerin Original Lotion  Eucerin Plus Crme Eucerin Plus Lotion  Eucerin TriLipid Replenishing Lotion  Keri Anti-Bacterial Hand Lotion  Keri Deep Conditioning Original Lotion Dry Skin Formula Softly Scented  Keri Deep Conditioning Original Lotion, Fragrance Free Sensitive Skin  Formula  Keri Lotion Fast Absorbing Fragrance Free Sensitive Skin Formula  Keri Lotion Fast Absorbing Softly Scented Dry Skin Formula  Keri Original Lotion  Keri Skin Renewal Lotion Keri Silky Smooth Lotion  Keri Silky Smooth Sensitive Skin Lotion  Nivea Body Creamy Conditioning Oil  Nivea Body Extra Enriched Lotion  Nivea Body Original Lotion  Nivea Body Sheer Moisturizing Lotion Nivea Crme  Nivea Skin Firming Lotion  NutraDerm 30 Skin Lotion  NutraDerm Skin Lotion  NutraDerm Therapeutic Skin Cream  NutraDerm Therapeutic Skin Lotion  ProShield Protective Hand Cream  Provon moisturizing lotion  Please read over the following fact sheets that you were given.

## 2023-09-09 ENCOUNTER — Encounter (HOSPITAL_COMMUNITY)
Admission: RE | Admit: 2023-09-09 | Discharge: 2023-09-09 | Disposition: A | Discharge status: Home / Self Care | Payer: Medicare Other | Source: Ambulatory Visit | Attending: Orthopaedic Surgery | Admitting: Orthopaedic Surgery

## 2023-09-09 ENCOUNTER — Encounter (HOSPITAL_COMMUNITY): Payer: Self-pay | Admitting: Vascular Surgery

## 2023-09-09 ENCOUNTER — Other Ambulatory Visit: Payer: Self-pay

## 2023-09-09 ENCOUNTER — Encounter (HOSPITAL_COMMUNITY): Payer: Self-pay

## 2023-09-09 VITALS — BP 179/88 | HR 72 | Temp 97.7°F | Resp 18 | Ht 60.0 in | Wt 124.5 lb

## 2023-09-09 DIAGNOSIS — Z01818 Encounter for other preprocedural examination: Secondary | ICD-10-CM | POA: Insufficient documentation

## 2023-09-09 DIAGNOSIS — M1612 Unilateral primary osteoarthritis, left hip: Secondary | ICD-10-CM | POA: Diagnosis not present

## 2023-09-09 DIAGNOSIS — I2510 Atherosclerotic heart disease of native coronary artery without angina pectoris: Secondary | ICD-10-CM | POA: Diagnosis not present

## 2023-09-09 HISTORY — DX: Other amnesia: R41.3

## 2023-09-09 LAB — SURGICAL PCR SCREEN
MRSA, PCR: NEGATIVE
Staphylococcus aureus: NEGATIVE

## 2023-09-09 LAB — CBC
HCT: 37.2 % (ref 36.0–46.0)
Hemoglobin: 12.3 g/dL (ref 12.0–15.0)
MCH: 28.7 pg (ref 26.0–34.0)
MCHC: 33.1 g/dL (ref 30.0–36.0)
MCV: 86.7 fL (ref 80.0–100.0)
Platelets: 404 10*3/uL — ABNORMAL HIGH (ref 150–400)
RBC: 4.29 MIL/uL (ref 3.87–5.11)
RDW: 13.2 % (ref 11.5–15.5)
WBC: 6.4 10*3/uL (ref 4.0–10.5)
nRBC: 0 % (ref 0.0–0.2)

## 2023-09-09 LAB — BASIC METABOLIC PANEL
Anion gap: 13 (ref 5–15)
BUN: 7 mg/dL — ABNORMAL LOW (ref 8–23)
CO2: 24 mmol/L (ref 22–32)
Calcium: 9.7 mg/dL (ref 8.9–10.3)
Chloride: 97 mmol/L — ABNORMAL LOW (ref 98–111)
Creatinine, Ser: 0.7 mg/dL (ref 0.44–1.00)
GFR, Estimated: >60 mL/min (ref >60)
Glucose, Bld: 98 mg/dL (ref 70–99)
Potassium: 3.8 mmol/L (ref 3.5–5.1)
Sodium: 134 mmol/L — ABNORMAL LOW (ref 135–145)

## 2023-09-09 LAB — TYPE AND SCREEN
ABO/RH(D): A NEG
Antibody Screen: NEGATIVE

## 2023-09-09 NOTE — Progress Notes (Addendum)
PCP -    Ngetich, Donalee Citrin, NP   Cardiologist - denies- pt reports that she saw a cardiologist in 2021 for palpitations- no issues since  PPM/ICD - denies   Chest x-ray - N/A EKG - 09/09/2023  Stress Test - denies ECHO - 12/26/19 Cardiac Cath - denies  Sleep Study - denies   Fasting Blood Sugar - N/A   Last dose of GLP1 agonist-  N/A   Blood Thinner Instructions: N/A Aspirin Instructions: Pt has not been given instructions. Patient and brother will call Dr. Warren Danes office for instructions.   ERAS Protcol - ERAS + ensure   COVID TEST- N/A   Anesthesia review: elevated BP at PAT. 188/97, 179/88. Pt reports BP at home being around 150 systolic. Pt unsure of diastolic, but states it is less than 100. Review EKG. Pt with some memory issues. She states that she stays home alone and takes care of herself, but her brother does help her.   Pt with thoracic aneurysm per CT on 05/19/23  I spoke with her brother at PAT, who said he would help call Dr. Warren Danes office regarding aspirin instructions. He has a list of questions for Dr. Roda Shutters regarding physical therapy, hospital stay, and after surgery information. Patient and bother agreed to read over pre-op instructions at home and call if he has any questions.   Patient denies shortness of breath, fever, cough and chest pain at PAT appointment   All instructions explained to the patient, with a verbal understanding of the material. Patient agrees to go over the instructions while at home for a better understanding.  The opportunity to ask questions was provided.

## 2023-09-12 ENCOUNTER — Other Ambulatory Visit: Payer: Self-pay | Admitting: Physician Assistant

## 2023-09-12 ENCOUNTER — Telehealth: Payer: Self-pay | Admitting: Orthopaedic Surgery

## 2023-09-12 MED ORDER — ASPIRIN 81 MG PO TBEC
81.0000 mg | DELAYED_RELEASE_TABLET | Freq: Two times a day (BID) | ORAL | 0 refills | Status: AC
Start: 2023-09-12 — End: 2024-09-11

## 2023-09-12 MED ORDER — DOCUSATE SODIUM 100 MG PO CAPS: 100.0000 mg | ORAL_CAPSULE | Freq: Every day | ORAL | 2 refills | Status: AC | PRN

## 2023-09-12 MED ORDER — ONDANSETRON HCL 4 MG PO TABS: 4.0000 mg | ORAL_TABLET | Freq: Three times a day (TID) | ORAL | 0 refills | Status: AC | PRN

## 2023-09-12 MED ORDER — OXYCODONE-ACETAMINOPHEN 5-325 MG PO TABS
1.0000 | ORAL_TABLET | Freq: Three times a day (TID) | ORAL | 0 refills | Status: DC | PRN
Start: 2023-09-12 — End: 2024-01-11

## 2023-09-12 MED ORDER — METHOCARBAMOL 750 MG PO TABS: 750.0000 mg | ORAL_TABLET | Freq: Two times a day (BID) | ORAL | 2 refills | Status: DC | PRN

## 2023-09-12 NOTE — Progress Notes (Signed)
Anesthesia Chart Review:   Case: [id number] Date/Time: 09/19/23 0926   Procedure: TOTAL HIP ARTHROPLASTY ANTERIOR APPROACH (Left: Hip) - 3-C   Anesthesia type: Spinal   Pre-op diagnosis: left hip osteoarthritis   Location: [facility] ROOM 06 / [facility] OR   Surgeons: Tarry Kos, MD       DISCUSSION: Patient is a 77 year old female scheduled for the above procedure.  History includes former smoker (quit 11/15/86), HTN, hypothyroidism, memory difficulty (mild), TAA 4.1 cm June 2024.   PAT BP elevated at 188/97, 179/88. She does monitor at home, and says SBP there typically around 150 and DBP < 100. 138/80 on 05/20/23, 121/87 on 04/08/23 at primary care visits.   She lives alone, but her brother also comes by to help her out. Anesthesia team to evaluate on the day of surgery.    VS: BP (!) 179/88   Pulse 72   Temp 36.5 C (Oral)   Resp 18   Ht 5' (1.524 m)   Wt 56.5 kg   SpO2 99%   BMI 24.31 kg/m    PROVIDERS: Ngetich, Donalee Citrin, NP is PCP Weston Brass, MD is cardiologist, last visit 01/28/20 for follow-up palpations. She wore a monitor ~ March 2021 that showed brief run of SVT, but no afib. Treatment of palpitations where deferred to the patient depending on her symptoms. Also discussed periodic monitoring of TAA (4.1 cm by 05/12/23 CT).    LABS: Labs reviewed: Acceptable for surgery. AST and ALT normal in March.  (all labs ordered are listed, but only abnormal results are displayed)  Labs Reviewed  CBC - Abnormal; Notable for the following components:      Result Value   Platelets 404 (*)    All other components within normal limits  BASIC METABOLIC PANEL - Abnormal; Notable for the following components:   Sodium 134 (*)    Chloride 97 (*)    BUN 7 (*)    All other components within normal limits  SURGICAL PCR SCREEN  TYPE AND SCREEN     IMAGES: CT Chest 05/12/23: IMPRESSION: - 8 mm left lower lobe pulmonary nodule has been stable over 2 years and is compatible with a benign  nodule. -  4.1 cm ascending thoracic aortic aneurysm. Recommend annual imaging followup by CTA or MRA. This recommendation follows 2010 ACCF/AHA/AATS/ACR/ASA/SCA/SCAI/SIR/STS/SVM Guidelines for the Diagnosis and Management of Patients with Thoracic Aortic Disease. Circulation. 2010; 121: Y782-N562. Aortic aneurysm NOS (ICD10-I71.9) - No acute cardiopulmonary disease. - Coronary artery disease. - Aortic Atherosclerosis (ICD10-I70.0).   EKG: 09/09/23: Normal sinus rhythm Possible Left atrial enlargement Borderline ECG No previous ECGs available Confirmed by Riley Lam [phone]) on 09/09/2023 8:59:43 PM   CV: Long term monitor 02/10/20 - 02/24/20: IMPRESSION:  Brief episodes of SVT. No atrial fibrillation or ventricular tachycardia. Diary events cannot be correlated specifically to rhythm, but likely related to episodes of SVT.    Echo 12/26/19: IMPRESSIONS   1. Left ventricular ejection fraction, by estimation, is 60 to 65%. The  left ventricle has normal function. The left ventrical has no regional  wall motion abnormalities. Left ventricular diastolic parameters are  consistent with Grade I diastolic  dysfunction (impaired relaxation).   2. Right ventricular systolic function is normal. The right ventricular  size is normal. Tricuspid regurgitation signal is inadequate for assessing  PA pressure.   3. The mitral valve is normal in structure and function. trivial mitral  valve regurgitation. No evidence of mitral stenosis.   4.  The aortic valve is tricuspid. Aortic valve regurgitation is not  visualized. No aortic stenosis is present.   5. Aortic dilatation noted. There is mild dilatation of the ascending  aorta measuring 42 mm.   6. The inferior vena cava is normal in size with greater than 50%  respiratory variability, suggesting right atrial pressure of 3 mmHg.    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    Past Surgical History:  Procedure Laterality Date   CATARACT EXTRACTION Bilateral    ECTOPIC PREGNANCY SURGERY      MEDICATIONS:  Acetaminophen (TYLENOL PO)   aspirin EC 81 MG tablet   docusate sodium (COLACE) 100 MG capsule   methocarbamol (ROBAXIN-750) 750 MG tablet   ondansetron (ZOFRAN) 4 MG tablet   oxyCODONE-acetaminophen (PERCOCET) 5-325 MG tablet   aspirin EC 81 MG tablet   atorvastatin (LIPITOR) 10 MG tablet   Coenzyme Q10 (COQ10) 200 MG CAPS   donepezil (ARICEPT) 5 MG tablet   ibuprofen (ADVIL) 200 MG tablet   levothyroxine (SYNTHROID) 112 MCG tablet   losartan (COZAAR) 25 MG tablet   TURMERIC PO   vitamin B-12 (CYANOCOBALAMIN) 500 MCG tablet   Vitamin D-Vitamin K (VITAMIN K2-VITAMIN D3 PO)   No current facility-administered medications for this encounter.    Shonna Chock, PA-C Surgical Short Stay/Anesthesiology [facility] Phone [phone] [facility] Phone [phone] 09/12/2023 6:30 PM

## 2023-09-12 NOTE — Anesthesia Preprocedure Evaluation (Signed)
Anesthesia Evaluation    Airway        Dental   Pulmonary former smoker          Cardiovascular hypertension,      Neuro/Psych    GI/Hepatic   Endo/Other    Renal/GU      Musculoskeletal   Abdominal   Peds  Hematology   Anesthesia Other Findings   Reproductive/Obstetrics                             Anesthesia Physical Anesthesia Plan  ASA:   Anesthesia Plan:    Post-op Pain Management:    Induction:   PONV Risk Score and Plan:   Airway Management Planned:   Additional Equipment:   Intra-op Plan:   Post-operative Plan:   Informed Consent:   Plan Discussed with:   Anesthesia Plan Comments: (PAT note written 09/12/2023 by Shonna Chock, PA-C.  )       Anesthesia Quick Evaluation

## 2023-09-12 NOTE — Progress Notes (Signed)
I spoke to patients brother tonight about her upcoming surgery.  He reached out to me earlier today about his concern that the patient would not be able to fully recover from surgery.  He feels that she has had a cognitive decline recently. Additionally her blood pressures have been fairly high and she has follow up with PCP late this week. In light of these details, they have decided to postpone surgery.

## 2023-09-12 NOTE — Telephone Encounter (Signed)
Patient's brother calling Dr. Roda Shutters back. He's [phone]

## 2023-09-16 DIAGNOSIS — Z9989 Dependence on other enabling machines and devices: Secondary | ICD-10-CM | POA: Diagnosis not present

## 2023-09-16 DIAGNOSIS — I10 Essential (primary) hypertension: Secondary | ICD-10-CM | POA: Diagnosis not present

## 2023-09-19 ENCOUNTER — Ambulatory Visit (HOSPITAL_COMMUNITY): Admission: RE | Admit: 2023-09-19 | Payer: Medicare Other | Source: Ambulatory Visit | Admitting: Orthopaedic Surgery

## 2023-09-19 ENCOUNTER — Encounter (HOSPITAL_COMMUNITY): Admission: RE | Payer: Self-pay | Source: Ambulatory Visit

## 2023-09-19 DIAGNOSIS — M1612 Unilateral primary osteoarthritis, left hip: Secondary | ICD-10-CM

## 2023-09-19 SURGERY — ARTHROPLASTY, HIP, TOTAL, ANTERIOR APPROACH
Anesthesia: Spinal | Site: Hip | Laterality: Left

## 2023-09-29 ENCOUNTER — Other Ambulatory Visit: Payer: Self-pay | Admitting: Family

## 2023-10-04 ENCOUNTER — Encounter: Payer: Medicare Other | Admitting: Physician Assistant

## 2023-10-11 ENCOUNTER — Other Ambulatory Visit: Payer: Self-pay | Admitting: Family

## 2023-10-17 DIAGNOSIS — M810 Age-related osteoporosis without current pathological fracture: Secondary | ICD-10-CM | POA: Diagnosis not present

## 2023-10-17 DIAGNOSIS — M1612 Unilateral primary osteoarthritis, left hip: Secondary | ICD-10-CM | POA: Diagnosis not present

## 2023-10-17 DIAGNOSIS — I10 Essential (primary) hypertension: Secondary | ICD-10-CM | POA: Diagnosis not present

## 2023-10-17 DIAGNOSIS — Z0000 Encounter for general adult medical examination without abnormal findings: Secondary | ICD-10-CM | POA: Diagnosis not present

## 2023-11-04 ENCOUNTER — Telehealth: Payer: Self-pay | Admitting: Orthopaedic Surgery

## 2023-11-04 NOTE — Telephone Encounter (Signed)
Patient's brother Valerie Reese calling about getting Valerie Reese set up for surgery.  She was scheduled back on 09-19-23 [facility] PATIENT)  for left total hip arthroplasty but was cancelled because of her blood pressure.  Valerie Reese states patient has gotten worse but does state her blood pressure is now under control.  He is asking if she needs a visit back in the office prior to moving forward with the surgery?  Marks call back is [phone].

## 2023-11-06 NOTE — Telephone Encounter (Signed)
Needs to see [place] back in the office

## 2023-11-23 ENCOUNTER — Ambulatory Visit: Payer: Medicare Other | Admitting: Orthopaedic Surgery

## 2023-11-23 ENCOUNTER — Other Ambulatory Visit (INDEPENDENT_AMBULATORY_CARE_PROVIDER_SITE_OTHER): Payer: Medicare Other

## 2023-11-23 DIAGNOSIS — M1612 Unilateral primary osteoarthritis, left hip: Secondary | ICD-10-CM

## 2023-11-23 NOTE — Progress Notes (Addendum)
 Office Visit Note   Patient: Valerie Reese           Date of Birth: 14-Sep-1946           MRN: [id number] Visit Date: 11/23/2023              Requested by: Leonarda Roxan JAYSON, NP [address] [place],  [place] [address] PCP: Leonarda Roxan JAYSON, NP   Assessment & Plan: Visit Diagnoses:  1. Primary osteoarthritis of left hip     Plan: Ms. Gerard a 78 year old female with end-stage bone-on-bone left hip DJD.  Her blood pressure is now well-managed and her quality of life has continued to decline because of the hip.  We had an extensive discussion about surgery and what recovery looks like.  They have decided to move forward with left total hip replacement.  Risk benefits prognosis reviewed.  Marval will call them to confirm surgery date.  Total face to face encounter time was greater than 25 minutes and over half of this time was spent in counseling and/or coordination of care.  Impression is severe left hip degenerative joint disease secondary to Osteoarthritis.  Imaging shows bone on bone joint space narrowing.  At this point, conservative treatments fail to provide any significant relief and the pain is severely affecting ADLs and quality of life.  Based on treatment options, the patient has elected to move forward with a hip replacement.  We have discussed the surgical risks that include but are not limited to infection, DVT, leg length discrepancy, numbness, tingling, incomplete relief of pain.  Recovery and prognosis were also reviewed.    Current anticoagulants: aspirin  81 mg daily Postop anticoagulation: Aspirin  81 mg Diabetic: No  Prior DVT/PE: No Tobacco use: No Clearances needed for surgery: Evan Madden - PCP Anticipate discharge dispo: home  with home health services   Follow-Up Instructions: No follow-ups on file.   Orders:  Orders Placed This Encounter  Procedures   XR HIP UNILAT W OR W/O PELVIS 2-3 VIEWS LEFT   No orders of the defined types were placed in this  encounter.     Procedures: No procedures performed   Clinical Data: No additional findings.   Subjective: Chief Complaint  Patient presents with   Left Hip - Pain    HPI Valerie Reese returns today with her brother for follow-up evaluation of left hip degenerative joint disease.  Her surgery in November was postponed due to uncontrolled hypertension.  She has since gotten this under control.  She continues to suffer from severe debilitating pain from the left hip that is affecting all aspects of daily activities.  She is using a cane.  She reports she has no quality of life.  Review of Systems  Constitutional: Negative.   HENT: Negative.    Eyes: Negative.   Respiratory: Negative.    Cardiovascular: Negative.   Endocrine: Negative.   Musculoskeletal: Negative.   Neurological: Negative.   Hematological: Negative.   Psychiatric/Behavioral: Negative.    All other systems reviewed and are negative.    Objective: Vital Signs: There were no vitals taken for this visit.  Physical Exam Vitals and nursing note reviewed.  Constitutional:      Appearance: She is well-developed.  HENT:     Head: Atraumatic.     Nose: Nose normal.  Eyes:     Extraocular Movements: Extraocular movements intact.  Cardiovascular:     Pulses: Normal pulses.  Pulmonary:     Effort: Pulmonary effort is normal.  Abdominal:     Palpations: Abdomen is soft.  Musculoskeletal:     Cervical back: Neck supple.  Skin:    General: Skin is warm.     Capillary Refill: Capillary refill takes less than 2 seconds.  Neurological:     Mental Status: She is alert. Mental status is at baseline.  Psychiatric:        Behavior: Behavior normal.        Thought Content: Thought content normal.        Judgment: Judgment normal.     Ortho Exam Exam of the left leg and hip shows leg length discrepancy.  Severe pain with movement of the hip joint.  Antalgic gait. Specialty Comments:  No specialty comments  available.  Imaging: XR HIP UNILAT W OR W/O PELVIS 2-3 VIEWS LEFT Result Date: 11/23/2023 Advanced degenerative joint disease with bone-on-bone joint space narrowing of the left hip.  There is shortening of the extremity.    PMFS History: Patient Active Problem List   Diagnosis Date Noted   Essential hypertension 01/30/2023   Acquired hypothyroidism 01/30/2023   Hyperlipidemia LDL goal <70 01/30/2023   Chronic pain of left knee 01/30/2023   Multiple lung nodules 05/06/2021   Past Medical History:  Diagnosis Date   Allergic rhinitis    Hypertension    Hypothyroid    age 60   Hypothyroidism    Memory difficulty    pt reports she functions normally with some memory problems- one doctor stated that she may have a mild dementia    Family History  Problem Relation Age of Onset   Aneurysm Mother    Fibroids Father    Pulmonary disease Father    Hypertension Father    Hypertension Brother    Lung cancer Maternal Grandfather     Past Surgical History:  Procedure Laterality Date   CATARACT EXTRACTION Bilateral    ECTOPIC PREGNANCY SURGERY     Social History   Occupational History   Occupation: Retired  Tobacco Use   Smoking status: Former    Current packs/day: 0.00    Average packs/day: 1 pack/day for 20.0 years (20.0 ttl pk-yrs)    Types: Cigarettes    Start date: 1968    Quit date: 1988    Years since quitting: 37.0    Passive exposure: Never   Smokeless tobacco: Never  Vaping Use   Vaping status: Never Used  Substance and Sexual Activity   Alcohol use: Yes    Comment: occasional    Drug use: Never   Sexual activity: Not on file

## 2023-12-24 ENCOUNTER — Other Ambulatory Visit: Payer: Self-pay | Admitting: Family

## 2023-12-30 ENCOUNTER — Other Ambulatory Visit: Payer: Self-pay | Admitting: Physician Assistant

## 2023-12-30 NOTE — Progress Notes (Signed)
Surgical Instructions   Your procedure is scheduled on Monday January 09, 2024. Report to [facility] Main Entrance "A" at 9:45 A.M., then check in with the Admitting office. Any questions or running late day of surgery: call [phone]  Questions prior to your surgery date: call [phone], Monday-Friday, 8am-4pm. If you experience any cold or flu symptoms such as cough, fever, chills, shortness of breath, etc. between now and your scheduled surgery, please notify us at the above number.     Remember:  Do not eat after midnight the night before your surgery  You may drink clear liquids until 9:15 the morning of your surgery.   Clear liquids allowed are: Water, Non-Citrus Juices (without pulp), Carbonated Beverages, Clear Tea (no milk, honey, etc.), Black Coffee Only (NO MILK, CREAM OR POWDERED CREAMER of any kind), and Gatorade.  Patient Instructions  The night before surgery:  No food after midnight. ONLY clear liquids after midnight  The day of surgery (if you do NOT have diabetes):  Drink ONE (1) Pre-Surgery Clear Ensure by 9:15 the morning of surgery. Drink in one sitting. Do not sip.  This drink was given to you during your hospital  pre-op appointment visit.  Nothing else to drink after completing the  Pre-Surgery Clear Ensure.         If you have questions, please contact your surgeon's office.    Take these medicines the morning of surgery with A SIP OF WATER  atorvastatin (LIPITOR)  levothyroxine (SYNTHROID)    May take these medicines IF NEEDED: methocarbamol (ROBAXIN-750)  ondansetron (ZOFRAN)   One week prior to surgery, STOP taking any Aspirin (unless otherwise instructed by your surgeon) Aleve, Naproxen, Ibuprofen, Motrin, Advil, Goody's, BC's, all herbal medications, fish oil, and non-prescription vitamins.                     Do NOT Smoke (Tobacco/Vaping) for 24 hours prior to your procedure.  If you use a CPAP at night, you may bring your  mask/headgear for your overnight stay.   You will be asked to remove any contacts, glasses, piercing's, hearing aid's, dentures/partials prior to surgery. Please bring cases for these items if needed.    Patients discharged the day of surgery will not be allowed to drive home, and someone needs to stay with them for 24 hours.  SURGICAL WAITING ROOM VISITATION Patients may have no more than 2 support people in the waiting area - these visitors may rotate.   Pre-op nurse will coordinate an appropriate time for 1 ADULT support person, who may not rotate, to accompany patient in pre-op.  Children under the age of 85 must have an adult with them who is not the patient and must remain in the main waiting area with an adult.  If the patient needs to stay at the hospital during part of their recovery, the visitor guidelines for inpatient rooms apply.  Please refer to the [facility] website for the visitor guidelines for any additional information.   If you received a COVID test during your pre-op visit  it is requested that you wear a mask when out in public, stay away from anyone that may not be feeling well and notify your surgeon if you develop symptoms. If you have been in contact with anyone that has tested positive in the last 10 days please notify you surgeon.      Pre-operative 5 CHG Bathing Instructions   You can play a key role in reducing  the risk of infection after surgery. Your skin needs to be as free of germs as possible. You can reduce the number of germs on your skin by washing with CHG (chlorhexidine gluconate) soap before surgery. CHG is an antiseptic soap that kills germs and continues to kill germs even after washing.   DO NOT use if you have an allergy to chlorhexidine/CHG or antibacterial soaps. If your skin becomes reddened or irritated, stop using the CHG and notify one of our RNs at [phone].   Please shower with the CHG soap starting 4 days before surgery using the  following schedule:     Please keep in mind the following:  DO NOT shave, including legs and underarms, starting the day of your first shower.   You may shave your face at any point before/day of surgery.  Place clean sheets on your bed the day you start using CHG soap. Use a clean washcloth (not used since being washed) for each shower. DO NOT sleep with pets once you start using the CHG.   CHG Shower Instructions:  Wash your face and private area with normal soap. If you choose to wash your hair, wash first with your normal shampoo.  After you use shampoo/soap, rinse your hair and body thoroughly to remove shampoo/soap residue.  Turn the water OFF and apply about 3 tablespoons (45 ml) of CHG soap to a CLEAN washcloth.  Apply CHG soap ONLY FROM YOUR NECK DOWN TO YOUR TOES (washing for 3-5 minutes)  DO NOT use CHG soap on face, private areas, open wounds, or sores.  Pay special attention to the area where your surgery is being performed.  If you are having back surgery, having someone wash your back for you may be helpful. Wait 2 minutes after CHG soap is applied, then you may rinse off the CHG soap.  Pat dry with a clean towel  Put on clean clothes/pajamas   If you choose to wear lotion, please use ONLY the CHG-compatible lotions that are listed below.  Additional instructions for the day of surgery: DO NOT APPLY any lotions, deodorants or perfumes.   Do not bring valuables to the hospital. [facility] is not responsible for any belongings/valuables. Do not wear nail polish, gel polish, artificial nails, or any other type of covering on natural nails (fingers and toes) Do not wear jewelry or makeup Put on clean/comfortable clothes.  Please brush your teeth.  Ask your nurse before applying any prescription medications to the skin.     CHG Compatible Lotions   Aveeno Moisturizing lotion  Cetaphil Moisturizing Cream  Cetaphil Moisturizing Lotion  Clairol Herbal Essence  Moisturizing Lotion, Dry Skin  Clairol Herbal Essence Moisturizing Lotion, Extra Dry Skin  Clairol Herbal Essence Moisturizing Lotion, Normal Skin  Curel Age Defying Therapeutic Moisturizing Lotion with Alpha Hydroxy  Curel Extreme Care Body Lotion  Curel Soothing Hands Moisturizing Hand Lotion  Curel Therapeutic Moisturizing Cream, Fragrance-Free  Curel Therapeutic Moisturizing Lotion, Fragrance-Free  Curel Therapeutic Moisturizing Lotion, Original Formula  Eucerin Daily Replenishing Lotion  Eucerin Dry Skin Therapy Plus Alpha Hydroxy Crme  Eucerin Dry Skin Therapy Plus Alpha Hydroxy Lotion  Eucerin Original Crme  Eucerin Original Lotion  Eucerin Plus Crme Eucerin Plus Lotion  Eucerin TriLipid Replenishing Lotion  Keri Anti-Bacterial Hand Lotion  Keri Deep Conditioning Original Lotion Dry Skin Formula Softly Scented  Keri Deep Conditioning Original Lotion, Fragrance Free Sensitive Skin Formula  Keri Lotion Fast Absorbing Fragrance Free Sensitive Skin Formula  Keri Lotion Fast  Absorbing Softly Scented Dry Skin Formula  Keri Original Lotion  Keri Skin Renewal Lotion Keri Silky Smooth Lotion  Keri Silky Smooth Sensitive Skin Lotion  Nivea Body Creamy Conditioning Oil  Nivea Body Extra Enriched Lotion  Nivea Body Original Lotion  Nivea Body Sheer Moisturizing Lotion Nivea Crme  Nivea Skin Firming Lotion  NutraDerm 30 Skin Lotion  NutraDerm Skin Lotion  NutraDerm Therapeutic Skin Cream  NutraDerm Therapeutic Skin Lotion  ProShield Protective Hand Cream  Provon moisturizing lotion  Please read over the following fact sheets that you were given.

## 2024-01-02 ENCOUNTER — Other Ambulatory Visit: Payer: Self-pay

## 2024-01-02 ENCOUNTER — Encounter (HOSPITAL_COMMUNITY)
Admission: RE | Admit: 2024-01-02 | Discharge: 2024-01-02 | Disposition: A | Discharge status: Home / Self Care | Payer: Medicare Other | Source: Ambulatory Visit | Attending: Orthopaedic Surgery | Admitting: Orthopaedic Surgery

## 2024-01-02 ENCOUNTER — Encounter (HOSPITAL_COMMUNITY): Payer: Self-pay

## 2024-01-02 VITALS — BP 152/88 | HR 68 | Temp 98.0°F | Resp 17 | Ht 62.0 in | Wt 123.4 lb

## 2024-01-02 DIAGNOSIS — Z01818 Encounter for other preprocedural examination: Secondary | ICD-10-CM | POA: Diagnosis not present

## 2024-01-02 DIAGNOSIS — Z96642 Presence of left artificial hip joint: Secondary | ICD-10-CM | POA: Diagnosis not present

## 2024-01-02 LAB — TYPE AND SCREEN
ABO/RH(D): A NEG
Antibody Screen: NEGATIVE

## 2024-01-02 LAB — BASIC METABOLIC PANEL
Anion gap: 8 (ref 5–15)
BUN: 12 mg/dL (ref 8–23)
CO2: 25 mmol/L (ref 22–32)
Calcium: 9.5 mg/dL (ref 8.9–10.3)
Chloride: 97 mmol/L — ABNORMAL LOW (ref 98–111)
Creatinine, Ser: 0.66 mg/dL (ref 0.44–1.00)
GFR, Estimated: >60 mL/min (ref >60)
Glucose, Bld: 106 mg/dL — ABNORMAL HIGH (ref 70–99)
Potassium: 4.4 mmol/L (ref 3.5–5.1)
Sodium: 130 mmol/L — ABNORMAL LOW (ref 135–145)

## 2024-01-02 LAB — CBC
HCT: 35.3 % — ABNORMAL LOW (ref 36.0–46.0)
Hemoglobin: 11.8 g/dL — ABNORMAL LOW (ref 12.0–15.0)
MCH: 28.6 pg (ref 26.0–34.0)
MCHC: 33.4 g/dL (ref 30.0–36.0)
MCV: 85.7 fL (ref 80.0–100.0)
Platelets: 394 10*3/uL (ref 150–400)
RBC: 4.12 MIL/uL (ref 3.87–5.11)
RDW: 13.1 % (ref 11.5–15.5)
WBC: 5.6 10*3/uL (ref 4.0–10.5)
nRBC: 0 % (ref 0.0–0.2)

## 2024-01-02 LAB — SURGICAL PCR SCREEN
MRSA, PCR: NEGATIVE
Staphylococcus aureus: NEGATIVE

## 2024-01-02 NOTE — Progress Notes (Signed)
PCP - Dr. Romona Curls Cardiologist - denies  PPM/ICD - denies Device Orders - n/a Rep Notified - n/a  Chest x-ray - denies EKG - 09/09/23 Stress Test - denies ECHO - 12/26/19 Cardiac Cath - denies  Sleep Study - denies CPAP - n/a  No DM  Last dose of GLP1 agonist-  n/a GLP1 instructions: n/a  Blood Thinner Instructions: n/a Aspirin Instructions: n/a - Aspirin is prescribed for post-op  ERAS Protcol - clears until 0915 PRE-SURGERY Ensure or G2-  Ensure as ordered  COVID TEST- no   Anesthesia review:  no  Patient denies shortness of breath, fever, cough and chest pain at PAT appointment   All instructions explained to the patient, with a verbal understanding of the material. Patient agrees to go over the instructions while at home for a better understanding. Patient also instructed to self quarantine after being tested for COVID-19. The opportunity to ask questions was provided.  Patient is alert and oriented times 4 at PAT appointment.

## 2024-01-03 NOTE — Progress Notes (Signed)
Anesthesia Chart Review:  Case: [id number] Date/Time: 01/09/24 1200   Procedure: LEFT TOTAL HIP ARTHROPLASTY ANTERIOR APPROACH (Left: Hip) - 3-C   Anesthesia type: Spinal   Pre-op diagnosis: left hip osteoarthritis   [facility] ROOM 05 / [facility]   Surgeons: Tarry Kos, MD       DISCUSSION: Patient is a 78 year old female scheduled for the above procedure. Surgery was initially scheduled for 09/19/23, but postponed to better optimize her for surgery. At that she was having higher BP readings (188/97, 179/88) and brother thought more recent cognitive decline. Since then she has had a couple of primary care visits with Dr. Rubye Oaks. BP 135/75 on 10/17/23 [facility]). As of 11/24/23, Dr. Rubye Oaks classified her as low risk with RCRI score of 0.  BP much better on amlodipine 5 mg and losartan 100 mg. May have some degree of white coat HTN. BPs at home have improved and are well-controlled.   History includes former smoker (quit 11/15/86), HTN, hypothyroidism, memory difficulty (mild), TAA 4.1 cm June 2024.   At 01/02/24 PAT, BP 152/88. By medication list, he is currently on losartan 25 mg daily.     She lives alone, but her brother also comes by to help her out. Anesthesia team to evaluate on the day of surgery.     VS: BP (!) 152/88   Pulse 68   Temp 36.7 C   Resp 17   Ht 5\' 2"  (1.575 m)   Wt 56 kg   SpO2 99%   BMI 22.57 kg/m   PROVIDERS: Romona Curls, MD is PCP [facility]) Weston Brass, MD is cardiologist, last visit 01/28/20 for follow-up palpations. She wore a monitor ~ March 2021 that showed brief run of SVT, but no afib. Treatment of palpitations where deferred to the patient depending on her symptoms. Also discussed periodic monitoring of TAA (4.1 cm by 05/12/23 CT).     LABS: Labs reviewed: Acceptable for surgery. (all labs ordered are listed, but only abnormal results are displayed)  Labs Reviewed  CBC - Abnormal; Notable for the following components:      Result  Value   Hemoglobin 11.8 (*)    HCT 35.3 (*)    All other components within normal limits  BASIC METABOLIC PANEL - Abnormal; Notable for the following components:   Sodium 130 (*)    Chloride 97 (*)    Glucose, Bld 106 (*)    All other components within normal limits  SURGICAL PCR SCREEN  TYPE AND SCREEN  TSH 1.55 on 09/05/23 [facility])   IMAGES: CT Chest 05/12/23: IMPRESSION: - 8 mm left lower lobe pulmonary nodule has been stable over 2 years and is compatible with a benign nodule. -  4.1 cm ascending thoracic aortic aneurysm. Recommend annual imaging followup by CTA or MRA. This recommendation follows 2010 ACCF/AHA/AATS/ACR/ASA/SCA/SCAI/SIR/STS/SVM Guidelines for the Diagnosis and Management of Patients with Thoracic Aortic Disease. Circulation. 2010; 121: A540-J811. Aortic aneurysm NOS (ICD10-I71.9) - No acute cardiopulmonary disease. - Coronary artery disease. - Aortic Atherosclerosis (ICD10-I70.0).     EKG: 09/09/23: Normal sinus rhythm Possible Left atrial enlargement Borderline ECG No previous ECGs available Confirmed by Riley Lam [phone]) on 09/09/2023 8:59:43 PM     CV: Long term monitor 02/10/20 - 02/24/20: IMPRESSION:  Brief episodes of SVT. No atrial fibrillation or ventricular tachycardia. Diary events cannot be correlated specifically to rhythm, but likely related to episodes of SVT.      Echo 12/26/19: IMPRESSIONS  1. Left ventricular ejection fraction, by estimation, is 60 to 65%. The  left ventricle has normal function. The left ventrical has no regional  wall motion abnormalities. Left ventricular diastolic parameters are  consistent with Grade I diastolic  dysfunction (impaired relaxation).   2. Right ventricular systolic function is normal. The right ventricular  size is normal. Tricuspid regurgitation signal is inadequate for assessing  PA pressure.   3. The mitral valve is normal in structure and function. trivial mitral   valve regurgitation. No evidence of mitral stenosis.   4. The aortic valve is tricuspid. Aortic valve regurgitation is not  visualized. No aortic stenosis is present.   5. Aortic dilatation noted. There is mild dilatation of the ascending  aorta measuring 42 mm.   6. The inferior vena cava is normal in size with greater than 50%  respiratory variability, suggesting right atrial pressure of 3 mmHg.    Past Medical History:  Diagnosis Date   Allergic rhinitis    Hypertension    Hypothyroid    age 21   Hypothyroidism    Memory difficulty    pt reports she functions normally with some memory problems- one doctor stated that she may have a mild dementia    Past Surgical History:  Procedure Laterality Date   CATARACT EXTRACTION Bilateral    ECTOPIC PREGNANCY SURGERY      MEDICATIONS:  aspirin EC 81 MG tablet   docusate sodium (COLACE) 100 MG capsule   methocarbamol (ROBAXIN-750) 750 MG tablet   ondansetron (ZOFRAN) 4 MG tablet   oxyCODONE-acetaminophen (PERCOCET) 5-325 MG tablet   atorvastatin (LIPITOR) 10 MG tablet   Cholecalciferol (VITAMIN D3 ULTRA STRENGTH) 125 MCG (5000 UT) capsule   Coenzyme Q10 (COQ10) 100 MG CAPS   donepezil (ARICEPT) 5 MG tablet   ibuprofen (ADVIL) 200 MG tablet   levothyroxine (SYNTHROID) 112 MCG tablet   losartan (COZAAR) 25 MG tablet   No current facility-administered medications for this encounter.    Shonna Chock, PA-C Surgical Short Stay/Anesthesiology [facility] Phone [phone] [facility] Phone [phone] 01/03/2024 5:14 PM

## 2024-01-03 NOTE — Anesthesia Preprocedure Evaluation (Addendum)
 Anesthesia Evaluation  Patient identified by MRN, date of birth, ID band Patient awake    Reviewed: Allergy & Precautions, H&P , NPO status , Patient's Chart, lab work & pertinent test results  Airway Mallampati: II   Neck ROM: full    Dental   Pulmonary former smoker   breath sounds clear to auscultation       Cardiovascular hypertension,  Rhythm:regular Rate:Normal     Neuro/Psych    GI/Hepatic   Endo/Other  Hypothyroidism    Renal/GU      Musculoskeletal  (+) Arthritis ,    Abdominal   Peds  Hematology   Anesthesia Other Findings   Reproductive/Obstetrics                             Anesthesia Physical Anesthesia Plan  ASA: 2  Anesthesia Plan: MAC and Spinal   Post-op Pain Management:    Induction: Intravenous  PONV Risk Score and Plan: 2 and Propofol infusion, Ondansetron and Treatment may vary due to age or medical condition  Airway Management Planned: Simple Face Mask  Additional Equipment:   Intra-op Plan:   Post-operative Plan:   Informed Consent: I have reviewed the patients History and Physical, chart, labs and discussed the procedure including the risks, benefits and alternatives for the proposed anesthesia with the patient or authorized representative who has indicated his/her understanding and acceptance.     Dental advisory given  Plan Discussed with: CRNA, Anesthesiologist and Surgeon  Anesthesia Plan Comments: (PAT note written 01/03/2024 by Shonna Chock, PA-C.  )       Anesthesia Quick Evaluation

## 2024-01-06 ENCOUNTER — Telehealth: Payer: Self-pay | Admitting: *Deleted

## 2024-01-06 MED ORDER — TRANEXAMIC ACID 1000 MG/10ML IV SOLN
2000.0000 mg | INTRAVENOUS | Status: DC
  Filled 2024-01-06: qty 20

## 2024-01-06 NOTE — Telephone Encounter (Signed)
Spoke again with Carola Rhine (brother of patient, who is her primary CG). Surgery is Monday and We discussed confusion after surgery with pain medications and muscle relaxers. He is picking up her medications today that were called in back in late October when she was canceled before. I checked with [facility] and they are going to go ahead and refill since she never picked them up. Are you good with these still. Percocet, Robaxin, Zofran, Colace, and Aspirin? We discussed that confusion may worsen after surgery and hopefully clears as pain becomes better. May need an extra day or two in the hospital. Thanks.

## 2024-01-07 DIAGNOSIS — M1612 Unilateral primary osteoarthritis, left hip: Secondary | ICD-10-CM | POA: Diagnosis not present

## 2024-01-07 NOTE — Telephone Encounter (Signed)
 Yeah that works. Thanks!!

## 2024-01-09 ENCOUNTER — Ambulatory Visit (HOSPITAL_COMMUNITY): Payer: Medicare Other

## 2024-01-09 ENCOUNTER — Other Ambulatory Visit: Payer: Self-pay

## 2024-01-09 ENCOUNTER — Encounter (HOSPITAL_COMMUNITY): Payer: Self-pay | Admitting: Orthopaedic Surgery

## 2024-01-09 ENCOUNTER — Observation Stay (HOSPITAL_COMMUNITY): Payer: Medicare Other

## 2024-01-09 ENCOUNTER — Observation Stay (HOSPITAL_COMMUNITY): Payer: Medicare Other | Admitting: Vascular Surgery

## 2024-01-09 ENCOUNTER — Inpatient Hospital Stay (HOSPITAL_COMMUNITY)
Admission: AD | Admit: 2024-01-09 | Discharge: 2024-01-11 | DRG: 470 | Disposition: A | Discharge status: Home Health | Payer: Medicare Other | Attending: Orthopaedic Surgery | Admitting: Orthopaedic Surgery

## 2024-01-09 ENCOUNTER — Encounter (HOSPITAL_COMMUNITY)
Admission: AD | Disposition: A | Discharge status: Home Health | Payer: Self-pay | Source: Home / Self Care | Attending: Orthopaedic Surgery

## 2024-01-09 ENCOUNTER — Observation Stay (HOSPITAL_COMMUNITY): Payer: Medicare Other | Admitting: Certified Registered"

## 2024-01-09 DIAGNOSIS — Z96642 Presence of left artificial hip joint: Secondary | ICD-10-CM | POA: Diagnosis not present

## 2024-01-09 DIAGNOSIS — Z79890 Hormone replacement therapy (postmenopausal): Secondary | ICD-10-CM

## 2024-01-09 DIAGNOSIS — E039 Hypothyroidism, unspecified: Secondary | ICD-10-CM | POA: Diagnosis present

## 2024-01-09 DIAGNOSIS — Z79899 Other long term (current) drug therapy: Secondary | ICD-10-CM

## 2024-01-09 DIAGNOSIS — Z8249 Family history of ischemic heart disease and other diseases of the circulatory system: Secondary | ICD-10-CM

## 2024-01-09 DIAGNOSIS — M2170 Unequal limb length (acquired), unspecified site: Secondary | ICD-10-CM | POA: Diagnosis not present

## 2024-01-09 DIAGNOSIS — M1612 Unilateral primary osteoarthritis, left hip: Secondary | ICD-10-CM | POA: Diagnosis not present

## 2024-01-09 DIAGNOSIS — Z9842 Cataract extraction status, left eye: Secondary | ICD-10-CM | POA: Diagnosis not present

## 2024-01-09 DIAGNOSIS — Z87891 Personal history of nicotine dependence: Secondary | ICD-10-CM

## 2024-01-09 DIAGNOSIS — Z9841 Cataract extraction status, right eye: Secondary | ICD-10-CM | POA: Diagnosis not present

## 2024-01-09 DIAGNOSIS — M25552 Pain in left hip: Secondary | ICD-10-CM | POA: Diagnosis not present

## 2024-01-09 DIAGNOSIS — I10 Essential (primary) hypertension: Secondary | ICD-10-CM | POA: Diagnosis not present

## 2024-01-09 HISTORY — PX: TOTAL HIP ARTHROPLASTY: SHX124

## 2024-01-09 LAB — POCT I-STAT, CHEM 8
BUN: 9 mg/dL (ref 8–23)
Calcium, Ion: 1.23 mmol/L (ref 1.15–1.40)
Chloride: 98 mmol/L (ref 98–111)
Creatinine, Ser: 0.7 mg/dL (ref 0.44–1.00)
Glucose, Bld: 90 mg/dL (ref 70–99)
HCT: 35 % — ABNORMAL LOW (ref 36.0–46.0)
Hemoglobin: 11.9 g/dL — ABNORMAL LOW (ref 12.0–15.0)
Potassium: 3.8 mmol/L (ref 3.5–5.1)
Sodium: 133 mmol/L — ABNORMAL LOW (ref 135–145)
TCO2: 25 mmol/L (ref 22–32)

## 2024-01-09 SURGERY — ARTHROPLASTY, HIP, TOTAL, ANTERIOR APPROACH
Anesthesia: Monitor Anesthesia Care | Site: Hip | Laterality: Left

## 2024-01-09 MED ORDER — POLYETHYLENE GLYCOL 3350 17 G PO PACK
17.0000 g | PACK | Freq: Every day | ORAL | Status: DC
  Administered 2024-01-09 – 2024-01-11 (×3): 17 g via ORAL
  Filled 2024-01-09 (×3): qty 1

## 2024-01-09 MED ORDER — ORAL CARE MOUTH RINSE: 15.0000 mL | Freq: Once | OROMUCOSAL | Status: AC

## 2024-01-09 MED ORDER — LACTATED RINGERS IV SOLN: INTRAVENOUS | Status: DC

## 2024-01-09 MED ORDER — POVIDONE-IODINE 10 % EX SWAB
2.0000 | Freq: Once | CUTANEOUS | Status: AC
  Administered 2024-01-09: 2 via TOPICAL

## 2024-01-09 MED ORDER — PANTOPRAZOLE SODIUM 40 MG PO TBEC
40.0000 mg | DELAYED_RELEASE_TABLET | Freq: Every day | ORAL | Status: DC
  Administered 2024-01-09 – 2024-01-11 (×3): 40 mg via ORAL
  Filled 2024-01-09 (×3): qty 1

## 2024-01-09 MED ORDER — SORBITOL 70 % SOLN: 30.0000 mL | Freq: Every day | Status: DC | PRN

## 2024-01-09 MED ORDER — BUPIVACAINE-MELOXICAM ER 400-12 MG/14ML IJ SOLN
INTRAMUSCULAR | Status: DC | PRN
  Administered 2024-01-09: 400 mg

## 2024-01-09 MED ORDER — METHOCARBAMOL 500 MG PO TABS
500.0000 mg | ORAL_TABLET | Freq: Four times a day (QID) | ORAL | Status: DC | PRN
  Administered 2024-01-10 – 2024-01-11 (×2): 500 mg via ORAL
  Filled 2024-01-09 (×2): qty 1

## 2024-01-09 MED ORDER — LEVOTHYROXINE SODIUM 112 MCG PO TABS
112.0000 ug | ORAL_TABLET | Freq: Every day | ORAL | Status: DC
Start: 2024-01-10 — End: 2024-01-11
  Administered 2024-01-10 – 2024-01-11 (×2): 112 ug via ORAL
  Filled 2024-01-09 (×2): qty 1

## 2024-01-09 MED ORDER — MAGNESIUM CITRATE PO SOLN: 1.0000 | Freq: Once | ORAL | Status: DC | PRN

## 2024-01-09 MED ORDER — ONDANSETRON HCL 4 MG/2ML IJ SOLN
4.0000 mg | Freq: Four times a day (QID) | INTRAMUSCULAR | Status: DC | PRN
  Administered 2024-01-09 – 2024-01-10 (×3): 4 mg via INTRAVENOUS
  Filled 2024-01-09 (×3): qty 2

## 2024-01-09 MED ORDER — MENTHOL 3 MG MT LOZG: 1.0000 | LOZENGE | OROMUCOSAL | Status: DC | PRN

## 2024-01-09 MED ORDER — METHOCARBAMOL 1000 MG/10ML IJ SOLN: 500.0000 mg | Freq: Four times a day (QID) | INTRAMUSCULAR | Status: DC | PRN

## 2024-01-09 MED ORDER — SODIUM CHLORIDE 0.9 % IR SOLN
Status: DC | PRN
  Administered 2024-01-09: 1000 mL

## 2024-01-09 MED ORDER — ACETAMINOPHEN 500 MG PO TABS
1000.0000 mg | ORAL_TABLET | Freq: Four times a day (QID) | ORAL | Status: AC
  Administered 2024-01-09 – 2024-01-10 (×3): 1000 mg via ORAL
  Filled 2024-01-09 (×3): qty 2

## 2024-01-09 MED ORDER — HYDROMORPHONE HCL 1 MG/ML IJ SOLN
0.5000 mg | INTRAMUSCULAR | Status: DC | PRN
  Administered 2024-01-09: 1 mg via INTRAVENOUS
  Filled 2024-01-09: qty 1

## 2024-01-09 MED ORDER — METOCLOPRAMIDE HCL 5 MG PO TABS: 5.0000 mg | ORAL_TABLET | Freq: Three times a day (TID) | ORAL | Status: DC | PRN

## 2024-01-09 MED ORDER — PHENOL 1.4 % MT LIQD: 1.0000 | OROMUCOSAL | Status: DC | PRN

## 2024-01-09 MED ORDER — CEFAZOLIN SODIUM-DEXTROSE 2-4 GM/100ML-% IV SOLN
2.0000 g | Freq: Four times a day (QID) | INTRAVENOUS | Status: AC
  Administered 2024-01-09 – 2024-01-10 (×2): 2 g via INTRAVENOUS
  Filled 2024-01-09 (×2): qty 100

## 2024-01-09 MED ORDER — FENTANYL CITRATE (PF) 250 MCG/5ML IJ SOLN
INTRAMUSCULAR | Status: DC | PRN
  Administered 2024-01-09: 25 ug via INTRAVENOUS
  Administered 2024-01-09: 50 ug via INTRAVENOUS

## 2024-01-09 MED ORDER — METOCLOPRAMIDE HCL 5 MG/ML IJ SOLN
5.0000 mg | Freq: Three times a day (TID) | INTRAMUSCULAR | Status: DC | PRN
  Administered 2024-01-09 – 2024-01-10 (×2): 10 mg via INTRAVENOUS
  Filled 2024-01-09 (×3): qty 2

## 2024-01-09 MED ORDER — TRANEXAMIC ACID-NACL 1000-0.7 MG/100ML-% IV SOLN
1000.0000 mg | Freq: Once | INTRAVENOUS | Status: AC
  Administered 2024-01-09: 1000 mg via INTRAVENOUS
  Filled 2024-01-09: qty 100

## 2024-01-09 MED ORDER — FENTANYL CITRATE (PF) 100 MCG/2ML IJ SOLN: 25.0000 ug | INTRAMUSCULAR | Status: DC | PRN

## 2024-01-09 MED ORDER — BUPIVACAINE IN DEXTROSE 0.75-8.25 % IT SOLN
INTRATHECAL | Status: DC | PRN
Start: 2024-01-09 — End: 2024-01-09
  Administered 2024-01-09: 1.6 mL via INTRATHECAL

## 2024-01-09 MED ORDER — OXYCODONE HCL 5 MG PO TABS
10.0000 mg | ORAL_TABLET | ORAL | Status: DC | PRN
  Administered 2024-01-10 – 2024-01-11 (×2): 15 mg via ORAL
  Filled 2024-01-09 (×2): qty 3

## 2024-01-09 MED ORDER — PRONTOSAN WOUND IRRIGATION OPTIME
TOPICAL | Status: DC | PRN
  Administered 2024-01-09: 500 mL

## 2024-01-09 MED ORDER — DOCUSATE SODIUM 100 MG PO CAPS
100.0000 mg | ORAL_CAPSULE | Freq: Two times a day (BID) | ORAL | Status: DC
  Administered 2024-01-09 – 2024-01-11 (×4): 100 mg via ORAL
  Filled 2024-01-09 (×4): qty 1

## 2024-01-09 MED ORDER — FENTANYL CITRATE (PF) 250 MCG/5ML IJ SOLN
INTRAMUSCULAR | Status: AC
  Filled 2024-01-09: qty 5

## 2024-01-09 MED ORDER — FERROUS SULFATE 325 (65 FE) MG PO TABS
325.0000 mg | ORAL_TABLET | Freq: Three times a day (TID) | ORAL | Status: DC
  Administered 2024-01-09 – 2024-01-11 (×4): 325 mg via ORAL
  Filled 2024-01-09 (×4): qty 1

## 2024-01-09 MED ORDER — CEFAZOLIN SODIUM-DEXTROSE 2-4 GM/100ML-% IV SOLN
2.0000 g | INTRAVENOUS | Status: AC
  Administered 2024-01-09: 2 g via INTRAVENOUS
  Filled 2024-01-09: qty 100

## 2024-01-09 MED ORDER — ALUM & MAG HYDROXIDE-SIMETH 200-200-20 MG/5ML PO SUSP: 30.0000 mL | ORAL | Status: DC | PRN

## 2024-01-09 MED ORDER — ONDANSETRON HCL 4 MG PO TABS: 4.0000 mg | ORAL_TABLET | Freq: Four times a day (QID) | ORAL | Status: DC | PRN

## 2024-01-09 MED ORDER — DEXAMETHASONE SODIUM PHOSPHATE 10 MG/ML IJ SOLN
10.0000 mg | Freq: Once | INTRAMUSCULAR | Status: AC
  Administered 2024-01-10: 10 mg via INTRAVENOUS
  Filled 2024-01-09: qty 1

## 2024-01-09 MED ORDER — CHLORHEXIDINE GLUCONATE 0.12 % MT SOLN
15.0000 mL | Freq: Once | OROMUCOSAL | Status: AC
  Administered 2024-01-09: 15 mL via OROMUCOSAL
  Filled 2024-01-09: qty 15

## 2024-01-09 MED ORDER — OXYCODONE HCL 5 MG/5ML PO SOLN: 5.0000 mg | Freq: Once | ORAL | Status: DC | PRN

## 2024-01-09 MED ORDER — ASPIRIN 81 MG PO CHEW
81.0000 mg | CHEWABLE_TABLET | Freq: Two times a day (BID) | ORAL | Status: DC
  Administered 2024-01-09 – 2024-01-11 (×4): 81 mg via ORAL
  Filled 2024-01-09 (×4): qty 1

## 2024-01-09 MED ORDER — PHENYLEPHRINE HCL-NACL 20-0.9 MG/250ML-% IV SOLN
INTRAVENOUS | Status: DC | PRN
  Administered 2024-01-09: 30 ug/min via INTRAVENOUS

## 2024-01-09 MED ORDER — DONEPEZIL HCL 5 MG PO TABS
5.0000 mg | ORAL_TABLET | Freq: Every day | ORAL | Status: DC
  Administered 2024-01-09 – 2024-01-10 (×2): 5 mg via ORAL
  Filled 2024-01-09 (×2): qty 1

## 2024-01-09 MED ORDER — PROPOFOL 500 MG/50ML IV EMUL
INTRAVENOUS | Status: DC | PRN
  Administered 2024-01-09: 80 ug/kg/min via INTRAVENOUS

## 2024-01-09 MED ORDER — ONDANSETRON HCL 4 MG/2ML IJ SOLN: 4.0000 mg | Freq: Four times a day (QID) | INTRAMUSCULAR | Status: DC | PRN

## 2024-01-09 MED ORDER — OXYCODONE HCL 5 MG PO TABS
5.0000 mg | ORAL_TABLET | ORAL | Status: DC | PRN
  Administered 2024-01-09: 5 mg via ORAL
  Administered 2024-01-10: 10 mg via ORAL
  Administered 2024-01-10: 5 mg via ORAL
  Filled 2024-01-09 (×2): qty 2
  Filled 2024-01-09: qty 1

## 2024-01-09 MED ORDER — TRANEXAMIC ACID 1000 MG/10ML IV SOLN
INTRAVENOUS | Status: DC | PRN
  Administered 2024-01-09: 2000 mg via TOPICAL

## 2024-01-09 MED ORDER — TRANEXAMIC ACID-NACL 1000-0.7 MG/100ML-% IV SOLN
1000.0000 mg | INTRAVENOUS | Status: AC
  Administered 2024-01-09: 1000 mg via INTRAVENOUS
  Filled 2024-01-09: qty 100

## 2024-01-09 MED ORDER — OXYCODONE HCL 5 MG PO TABS: 5.0000 mg | ORAL_TABLET | Freq: Once | ORAL | Status: DC | PRN

## 2024-01-09 MED ORDER — VANCOMYCIN HCL 1 G IV SOLR
INTRAVENOUS | Status: DC | PRN
  Administered 2024-01-09: 1000 mg via TOPICAL

## 2024-01-09 MED ORDER — LOSARTAN POTASSIUM 50 MG PO TABS
25.0000 mg | ORAL_TABLET | Freq: Every day | ORAL | Status: DC
  Administered 2024-01-09 – 2024-01-11 (×3): 25 mg via ORAL
  Filled 2024-01-09 (×3): qty 1

## 2024-01-09 MED ORDER — ACETAMINOPHEN 325 MG PO TABS: 325.0000 mg | ORAL_TABLET | Freq: Four times a day (QID) | ORAL | Status: DC | PRN

## 2024-01-09 MED ORDER — DIPHENHYDRAMINE HCL 12.5 MG/5ML PO ELIX: 25.0000 mg | ORAL_SOLUTION | ORAL | Status: DC | PRN

## 2024-01-09 MED ORDER — 0.9 % SODIUM CHLORIDE (POUR BTL) OPTIME
TOPICAL | Status: DC | PRN
  Administered 2024-01-09: 1000 mL

## 2024-01-09 SURGICAL SUPPLY — 54 items
BAG COUNTER SPONGE SURGICOUNT (BAG) ×1 IMPLANT
BAG DECANTER FOR FLEXI CONT (MISCELLANEOUS) ×1 IMPLANT
BLADE SAG 18X100X1.27 (BLADE) ×1 IMPLANT
COVER PERINEAL POST (MISCELLANEOUS) ×1 IMPLANT
COVER SURGICAL LIGHT HANDLE (MISCELLANEOUS) ×1 IMPLANT
DERMABOND ADVANCED .7 DNX12 (GAUZE/BANDAGES/DRESSINGS) IMPLANT
DRAPE C-ARM 42X72 X-RAY (DRAPES) ×1 IMPLANT
DRAPE POUCH INSTRU U-SHP 10X18 (DRAPES) ×1 IMPLANT
DRAPE STERI IOBAN 125X83 (DRAPES) ×1 IMPLANT
DRAPE U-SHAPE 47X51 STRL (DRAPES) ×2 IMPLANT
DRSG AQUACEL AG ADV 3.5X10 (GAUZE/BANDAGES/DRESSINGS) ×1 IMPLANT
DURAPREP 26ML APPLICATOR (WOUND CARE) ×2 IMPLANT
ELECT BLADE 4.0 EZ CLEAN MEGAD (MISCELLANEOUS) ×1 IMPLANT
ELECT REM PT RETURN 9FT ADLT (ELECTROSURGICAL) ×1 IMPLANT
ELECTRODE BLDE 4.0 EZ CLN MEGD (MISCELLANEOUS) ×1 IMPLANT
ELECTRODE REM PT RTRN 9FT ADLT (ELECTROSURGICAL) ×1 IMPLANT
GLOVE BIOGEL PI IND STRL 7.0 (GLOVE) ×2 IMPLANT
GLOVE BIOGEL PI IND STRL 7.5 (GLOVE) ×5 IMPLANT
GLOVE ECLIPSE 7.0 STRL STRAW (GLOVE) ×2 IMPLANT
GLOVE SKINSENSE STRL SZ7.5 (GLOVE) ×1 IMPLANT
GLOVE SURG SYN 7.5 E (GLOVE) ×2 IMPLANT
GLOVE SURG SYN 7.5 PF PI (GLOVE) ×2 IMPLANT
GLOVE SURG UNDER POLY LF SZ7 (GLOVE) ×3 IMPLANT
GLOVE SURG UNDER POLY LF SZ7.5 (GLOVE) ×2 IMPLANT
GOWN STRL REUS W/ TWL LRG LVL3 (GOWN DISPOSABLE) IMPLANT
GOWN STRL REUS W/ TWL XL LVL3 (GOWN DISPOSABLE) ×1 IMPLANT
GOWN STRL SURGICAL XL XLNG (GOWN DISPOSABLE) ×1 IMPLANT
GOWN TOGA ZIPPER T7+ PEEL AWAY (MISCELLANEOUS) ×1 IMPLANT
HEAD M SROM 36MM PLUS 1.5 (Hips) IMPLANT
HOOD PEEL AWAY T7 (MISCELLANEOUS) ×1 IMPLANT
IV NS IRRIG 3000ML ARTHROMATIC (IV SOLUTION) ×1 IMPLANT
KIT BASIN OR (CUSTOM PROCEDURE TRAY) ×1 IMPLANT
LINER NEUTRAL 52X36MM PLUS 4 (Liner) IMPLANT
MARKER SKIN DUAL TIP RULER LAB (MISCELLANEOUS) ×1 IMPLANT
NDL SPNL 18GX3.5 QUINCKE PK (NEEDLE) ×1 IMPLANT
NEEDLE SPNL 18GX3.5 QUINCKE PK (NEEDLE) ×1 IMPLANT
PACK TOTAL JOINT (CUSTOM PROCEDURE TRAY) ×1 IMPLANT
PACK UNIVERSAL I (CUSTOM PROCEDURE TRAY) ×1 IMPLANT
PIN SECTOR W/GRIP ACE CUP 52MM (Hips) IMPLANT
SCREW 6.5MMX25MM (Screw) IMPLANT
SET HNDPC FAN SPRY TIP SCT (DISPOSABLE) ×1 IMPLANT
SOLUTION PRONTOSAN WOUND 350ML (IRRIGATION / IRRIGATOR) ×1 IMPLANT
SROM M HEAD 36MM PLUS 1.5 (Hips) ×1 IMPLANT
STEM FEMORAL SZ5 HIGH ACTIS (Stem) IMPLANT
SUT ETHIBOND 2 V 37 (SUTURE) ×1 IMPLANT
SUT VIC AB 0 CT1 27XBRD ANBCTR (SUTURE) ×1 IMPLANT
SUT VIC AB 1 CTX36XBRD ANBCTR (SUTURE) ×1 IMPLANT
SUT VIC AB 2-0 CT1 TAPERPNT 27 (SUTURE) ×2 IMPLANT
SYR 50ML LL SCALE MARK (SYRINGE) ×1 IMPLANT
TOWEL GREEN STERILE (TOWEL DISPOSABLE) ×1 IMPLANT
TRAY CATH INTERMITTENT SS 16FR (CATHETERS) IMPLANT
TRAY FOLEY W/BAG SLVR 16FR ST (SET/KITS/TRAYS/PACK) IMPLANT
TUBE SUCT ARGYLE STRL (TUBING) ×1 IMPLANT
YANKAUER SUCT BULB TIP NO VENT (SUCTIONS) ×1 IMPLANT

## 2024-01-09 NOTE — Evaluation (Signed)
 Physical Therapy Evaluation Patient Details Name: Valerie Reese MRN: [id number] DOB: 1946/01/18 Today's Date: 01/09/2024  History of Present Illness  78 y.o. female presents to [facility] hospital on 01/09/2024 for elective L THA. PMH includes HLD, HTN, lung nodules.  Clinical Impression  Pt presents to PT with deficits in strength, power, functional mobility, gait, balance. Pt reporting numbness in BLE posteriorly. Pt tends to sink into hip flexion when standing, possibly related to continued effects of spinal block. PT defers ambulation away from bedside due to these symptoms, limiting pt to standing at this time. PT will follow up tomorrow for progression of mobility and education on surgical hip exercise packet.        If plan is discharge home, recommend the following: A little help with walking and/or transfers;A little help with bathing/dressing/bathroom;Assistance with cooking/housework;Assist for transportation;Help with stairs or ramp for entrance   Can travel by private vehicle        Equipment Recommendations Rolling walker (2 wheels);BSC/3in1  Recommendations for Other Services       Functional Status Assessment Patient has had a recent decline in their functional status and demonstrates the ability to make significant improvements in function in a reasonable and predictable amount of time.     Precautions / Restrictions Precautions Precautions: Fall Recall of Precautions/Restrictions: Intact Precaution/Restrictions Comments: direct anterior THA Restrictions Weight Bearing Restrictions Per Provider Order: Yes LLE Weight Bearing Per Provider Order: Weight bearing as tolerated      Mobility  Bed Mobility Overal bed mobility: Needs Assistance Bed Mobility: Supine to Sit, Sit to Supine     Supine to sit: Contact guard Sit to supine: Min assist        Transfers Overall transfer level: Needs assistance Equipment used: Rolling walker (2 wheels) Transfers: Sit  to/from Stand Sit to Stand: Contact guard assist                Ambulation/Gait Ambulation/Gait assistance: Min assist Gait Distance (Feet): 2 Feet Assistive device: Rolling walker (2 wheels) Gait Pattern/deviations: Shuffle Gait velocity: reduced Gait velocity interpretation: <1.31 ft/sec, indicative of household ambulator   General Gait Details: pt side steps to left side, ambulation away from bed deferred as pt begins to consistently sink into hip flexion with pre-gait training, likely related to impaired sensation/proprioception remaining from spinal block  Stairs            Wheelchair Mobility     Tilt Bed    Modified Rankin (Stroke Patients Only)       Balance Overall balance assessment: Needs assistance Sitting-balance support: No upper extremity supported, Feet supported Sitting balance-Leahy Scale: Good     Standing balance support: Bilateral upper extremity supported, Reliant on assistive device for balance Standing balance-Leahy Scale: Poor                               Pertinent Vitals/Pain Pain Assessment Pain Assessment: 0-10 Pain Score: 8  Pain Location: L hip Pain Descriptors / Indicators: Sore Pain Intervention(s): Monitored during session    Home Living Family/patient expects to be discharged to:: Private residence Living Arrangements: Alone Available Help at Discharge: Family;Available PRN/intermittently Type of Home: House Home Access:  (need to confirm if pt has steps to enter home)       Home Layout: One level Home Equipment: Cane - single point      Prior Function Prior Level of Function : Independent/Modified Independent;Driving  Mobility Comments: enjoys walking her 3 dogs       Extremity/Trunk Assessment   Upper Extremity Assessment Upper Extremity Assessment: Overall WFL for tasks assessed    Lower Extremity Assessment Lower Extremity Assessment: LLE deficits/detail;RLE  deficits/detail RLE Deficits / Details: ROM WFL RLE Sensation: decreased light touch;decreased proprioception (pt with posterior LE numbness remaining from spinal block) LLE Deficits / Details: ROM WFL, grossly 4-/5 LLE Sensation: decreased light touch;decreased proprioception (pt with posterior LE numbness remaining from spinal block)    Cervical / Trunk Assessment Cervical / Trunk Assessment: Normal  Communication   Communication Communication: No apparent difficulties    Cognition Arousal: Alert Behavior During Therapy: WFL for tasks assessed/performed   PT - Cognitive impairments: No apparent impairments                         Following commands: Intact       Cueing Cueing Techniques: Verbal cues     General Comments General comments (skin integrity, edema, etc.): VSS on RA    Exercises     Assessment/Plan    PT Assessment Patient needs continued PT services  PT Problem List Decreased strength;Decreased activity tolerance;Decreased balance;Decreased mobility;Decreased knowledge of use of DME;Pain       PT Treatment Interventions DME instruction;Gait training;Stair training;Functional mobility training;Therapeutic activities;Therapeutic exercise;Balance training;Neuromuscular re-education;Patient/family education    PT Goals (Current goals can be found in the Care Plan section)  Acute Rehab PT Goals Patient Stated Goal: to return to walking her dogs PT Goal Formulation: With patient Time For Goal Achievement: 01/13/24 Potential to Achieve Goals: Good    Frequency 7X/week     Co-evaluation               AM-PAC PT "6 Clicks" Mobility  Outcome Measure Help needed turning from your back to your side while in a flat bed without using bedrails?: A Little Help needed moving from lying on your back to sitting on the side of a flat bed without using bedrails?: A Little Help needed moving to and from a bed to a chair (including a wheelchair)?: A  Little Help needed standing up from a chair using your arms (e.g., wheelchair or bedside chair)?: A Little Help needed to walk in hospital room?: Total Help needed climbing 3-5 steps with a railing? : Total 6 Click Score: 14    End of Session Equipment Utilized During Treatment: Gait belt Activity Tolerance: Treatment limited secondary to medical complications (Comment) (numbness from spinal block) Patient left: in bed;with call bell/phone within reach;with bed alarm set Nurse Communication: Mobility status PT Visit Diagnosis: Other abnormalities of gait and mobility (R26.89);Muscle weakness (generalized) (M62.81);Other symptoms and signs involving the nervous system (R29.898)    Time: 1740-1755 PT Time Calculation (min) (ACUTE ONLY): 15 min   Charges:   PT Evaluation $PT Eval Low Complexity: 1 Low   PT General Charges $$ ACUTE PT VISIT: 1 Visit         Arlyss Gandy, PT, DPT Acute Rehabilitation Office [phone]   Arlyss Gandy 01/09/2024, 6:02 PM

## 2024-01-09 NOTE — H&P (Signed)
 PREOPERATIVE H&P  Chief Complaint: left hip osteoarthritis  HPI: Valerie Reese is a 78 y.o. female who presents for surgical treatment of left hip osteoarthritis.  She denies any changes in medical history.  Past Surgical History:  Procedure Laterality Date   CATARACT EXTRACTION Bilateral    ECTOPIC PREGNANCY SURGERY     Social History   Socioeconomic History   Marital status: Divorced    Spouse name: Not on file   Number of children: 0   Years of education: Not on file   Highest education level: Not on file  Occupational History   Occupation: Retired  Tobacco Use   Smoking status: Former    Current packs/day: 0.00    Average packs/day: 1 pack/day for 20.0 years (20.0 ttl pk-yrs)    Types: Cigarettes    Start date: 66    Quit date: 1988    Years since quitting: 37.1    Passive exposure: Never   Smokeless tobacco: Never  Vaping Use   Vaping status: Never Used  Substance and Sexual Activity   Alcohol use: Yes    Comment: occasional    Drug use: Never   Sexual activity: Not on file  Other Topics Concern   Not on file  Social History Narrative   Lives with her 2 dogs   Social Drivers of Health   Financial Resource Strain: Patient Declined (04/06/2023)   Overall Financial Resource Strain (CARDIA)    Difficulty of Paying Living Expenses: Patient declined  Food Insecurity: Patient Declined (04/06/2023)   Hunger Vital Sign    Worried About Running Out of Food in the Last Year: Patient declined    Ran Out of Food in the Last Year: Patient declined  Transportation Needs: No Transportation Needs (04/06/2023)   PRAPARE - Administrator, Civil Service (Medical): No    Lack of Transportation (Non-Medical): No  Physical Activity: Sufficiently Active (04/06/2023)   Exercise Vital Sign    Days of Exercise per Week: 7 days    Minutes of Exercise per Session: 60 min  Stress: No Stress Concern Present (04/06/2023)   [employer] of Occupational Health  - Occupational Stress Questionnaire    Feeling of Stress : Not at all  Social Connections: Unknown (04/06/2023)   Social Connection and Isolation Panel [NHANES]    Frequency of Communication with Friends and Family: Once a week    Frequency of Social Gatherings with Friends and Family: Patient declined    Attends Religious Services: Patient declined    Database administrator or Organizations: No    Attends Engineer, structural: Not on file    Marital Status: Divorced   Family History  Problem Relation Age of Onset   Aneurysm Mother    Fibroids Father    Pulmonary disease Father    Hypertension Father    Hypertension Brother    Lung cancer Maternal Grandfather    No Known Allergies Prior to Admission medications   Medication Sig Start Date End Date Taking? Authorizing Provider  aspirin EC 81 MG tablet Take 1 tablet (81 mg total) by mouth 2 (two) times daily. To be taken after surgery to prevent blood clots 09/12/23 09/11/24  Cristie Hem, PA-C  atorvastatin (LIPITOR) 10 MG tablet Take 1 tablet (10 mg total) by mouth daily. 03/09/23  Yes Ngetich, Dinah C, NP  Cholecalciferol (VITAMIN D3 ULTRA STRENGTH) 125 MCG (5000 UT) capsule Take 5,000 Units by mouth daily.   Yes [provider]  Coenzyme Q10 (COQ10) 100 MG CAPS Take 100 mg by mouth daily.   Yes [provider]  docusate sodium (COLACE) 100 MG capsule Take 1 capsule (100 mg total) by mouth daily as needed. 09/12/23 09/11/24  Cristie Hem, PA-C  donepezil (ARICEPT) 5 MG tablet TAKE 1 TABLET BY MOUTH AT BEDTIME 08/01/23  Yes Ngetich, Dinah C, NP  ibuprofen (ADVIL) 200 MG tablet Take 400 mg by mouth 3 (three) times daily.   Yes [provider]  levothyroxine (SYNTHROID) 112 MCG tablet TAKE 1 TABLET BY MOUTH ONCE DAILY BEFORE BREAKFAST 10/11/23  Yes Ngetich, Dinah C, NP  losartan (COZAAR) 25 MG tablet Take 1 tablet by mouth once daily 12/26/23  Yes Mahlon Gammon, MD  methocarbamol (ROBAXIN-750)  750 MG tablet Take 1 tablet (750 mg total) by mouth 2 (two) times daily as needed for muscle spasms. 09/12/23   Cristie Hem, PA-C  ondansetron (ZOFRAN) 4 MG tablet Take 1 tablet (4 mg total) by mouth every 8 (eight) hours as needed for nausea or vomiting. 09/12/23   Cristie Hem, PA-C  oxyCODONE-acetaminophen (PERCOCET) 5-325 MG tablet Take 1-2 tablets by mouth every 8 (eight) hours as needed. To be taken after surgery 09/12/23   Cristie Hem, PA-C     Positive ROS: All other systems have been reviewed and were otherwise negative with the exception of those mentioned in the HPI and as above.  Physical Exam: General: Alert, no acute distress Cardiovascular: No pedal edema Respiratory: No cyanosis, no use of accessory musculature GI: abdomen soft Skin: No lesions in the area of chief complaint Neurologic: Sensation intact distally Psychiatric: Patient is competent for consent with normal mood and affect Lymphatic: no lymphedema  MUSCULOSKELETAL: exam stable  Assessment: left hip osteoarthritis  Plan: Plan for Procedure(s): LEFT TOTAL HIP ARTHROPLASTY ANTERIOR APPROACH  The risks benefits and alternatives were discussed with the patient including but not limited to the risks of nonoperative treatment, versus surgical intervention including infection, bleeding, nerve injury,  blood clots, cardiopulmonary complications, morbidity, mortality, among others, and they were willing to proceed.   Glee Arvin, MD 01/09/2024 10:31 AM

## 2024-01-09 NOTE — Transfer of Care (Signed)
 Immediate Anesthesia Transfer of Care Note  Patient: Valerie Reese  Procedure(s) Performed: LEFT TOTAL HIP ARTHROPLASTY ANTERIOR APPROACH (Left: Hip)  Patient Location: PACU  Anesthesia Type:Spinal  Level of Consciousness: awake and patient cooperative  Airway & Oxygen Therapy: Patient Spontanous Breathing and Patient connected to face mask oxygen  Post-op Assessment: Report given to RN and Post -op Vital signs reviewed and stable  Post vital signs: Reviewed and stable  Last Vitals:  Vitals Value Taken Time  BP 114/70 01/09/24 1432  Temp    Pulse 53 01/09/24 1433  Resp 17 01/09/24 1433  SpO2 100 % 01/09/24 1433  Vitals shown include unfiled device data.  Last Pain:  Vitals:   01/09/24 1019  PainSc: 5          Complications: No notable events documented.

## 2024-01-09 NOTE — Anesthesia Procedure Notes (Signed)
 Spinal  Patient location during procedure: OR Start time: 01/09/2024 12:47 PM End time: 01/09/2024 12:51 PM Reason for block: surgical anesthesia Staffing Performed: anesthesiologist  Anesthesiologist: Achille Rich, MD Performed by: Achille Rich, MD Authorized by: Mariann Barter, MD   Preanesthetic Checklist Completed: patient identified, IV checked, risks and benefits discussed, surgical consent, monitors and equipment checked, pre-op evaluation and timeout performed Spinal Block Patient position: sitting Prep: DuraPrep Patient monitoring: cardiac monitor, continuous pulse ox and blood pressure Approach: midline Location: L3-4 Injection technique: single-shot Needle Needle type: Pencan  Needle gauge: 24 G Needle length: 9 cm Assessment Sensory level: T10 Events: CSF return Additional Notes Functioning IV was confirmed and monitors were applied. Sterile prep and drape, including hand hygiene and sterile gloves were used. The patient was positioned and the spine was prepped. The skin was anesthetized with lidocaine.  Free flow of clear CSF was obtained prior to injecting local anesthetic into the CSF.  The spinal needle aspirated freely following injection.  The needle was carefully withdrawn.  The patient tolerated the procedure well.

## 2024-01-09 NOTE — Op Note (Signed)
 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 Total hip replacement; Left hip; uncemented [id number]   Surgeon: Gershon Mussel, M.D.  Assist: Oneal Grout, PA-C   Anesthesia: spinal  Prosthesis: Depuy Acetabulum: Pinnacle 52 mm Femur: Actis 5 HO Head: 36 mm size: +1.5 Liner: +4 Bearing Type: metal/poly  Total Hip Arthroplasty (Anterior Approach) Op Note:  After informed consent was obtained and the operative extremity marked in the holding area, the patient was brought back to the operating room and placed supine on the HANA table. Next, the operative extremity was prepped and draped in normal sterile fashion. Surgical timeout occurred verifying patient identification, surgical site, surgical procedure and administration of antibiotics.  A 10 cm longitudinal incision was made starting from 2 fingerbreadths lateral and inferior to the ASIS towards the lateral aspect of the patella.  A Hueter approach to the hip was performed, using the interval between tensor fascia lata and sartorius.  Dissection was carried bluntly down onto the anterior hip capsule. The lateral femoral circumflex vessels were identified and coagulated. A capsulotomy was performed and the capsular flaps tagged for later repair.  The neck osteotomy was performed. The femoral head was removed which showed severe degenerative wear and remodeling, the acetabular rim was cleared of soft tissue and osteophytes and attention was turned to reaming the acetabulum.  Sequential reaming was performed under fluoroscopic guidance down to the floor of the cotyloid fossa. We reamed to a size 51 mm, and then impacted the acetabular shell. A 25 mm cancellous screw was placed to secure the shell.  The liner was then  placed after irrigation and attention turned to the femur.  After placing the femoral hook, the leg was taken to externally rotated, extended and adducted position taking care to perform soft tissue releases to allow for adequate mobilization of the femur. Soft tissue was cleared from the shoulder of the greater trochanter and the hook elevator used to improve exposure of the proximal femur. Sequential broaching performed up to a size 5. Trial neck and head were placed. The leg was brought back up to neutral and the construct reduced.  The position and sizing of components, offset and leg lengths were checked using fluoroscopy. Stability of the construct was checked in 45 degrees of hip extension and 90 degrees of external rotation without any subluxation, shuck or impingement of prosthesis. We dislocated the prosthesis, dropped the leg back into position, removed trial components, and irrigated copiously. The final stem and head was then placed, the leg brought back up, the system reduced and fluoroscopy used to verify positioning.  Antibiotic irrigation was placed in the surgical wound.   We irrigated, obtained hemostasis and closed the capsule using #2 ethibond suture.  A topical mixture of 0.25% bupivacaine and meloxicam was placed deep to the fascia.  One gram of vancomycin powder was placed in the surgical bed.   One gram of topical tranexamic acid was injected into the joint.  The fascia was closed with #1 stratafix, the deep fat layer was closed with 0 vicryl, the subcutaneous layers closed with 2.0 Vicryl Plus and the skin closed with 2.0 nylon and dermabond. A sterile dressing was applied. The patient was awakened in the operating room and taken to recovery in stable condition.  All sponge,  needle, and instrument counts were correct at the end of the case.   Tessa Lerner, my PA, was a medical necessity for opening, closing, limb positioning, retracting, exposing, and overall facilitation and  timely completion of the surgery.  Position: supine  Complications: see description of procedure.  Time Out: performed   Drains/Packing: none  Estimated blood loss: see anesthesia record  Returned to Recovery Room: in good condition.   Antibiotics: yes   Mechanical VTE (DVT) Prophylaxis: sequential compression devices, TED thigh-high  Chemical VTE (DVT) Prophylaxis: aspirin   Fluid Replacement: see anesthesia record  Specimens Removed: 1 to pathology   Sponge and Instrument Count Correct? yes   PACU: portable radiograph - low AP   Plan/RTC: Return in 2 weeks for staple removal. Weight Bearing/Load Lower Extremity: full  Hip precautions: none Suture Removal: 2 weeks   N. Glee Arvin, MD Aldean Baker 2:11 PM   Implant Name Type Inv. Item Serial No. Manufacturer Lot No. LRB No. Used Action  PIN SECTOR W/GRIP ACE CUP - [id number] Hips PIN SECTOR W/GRIP ACE CUP  [employer] [id number] Left 1 Implanted  LINER NEUTRAL 52X36MM PLUS 4 - [id number] Liner LINER NEUTRAL 52X36MM PLUS 4  [employer] [id number] Left 1 Implanted  SCREW 6.5MMX25MM - [id number] Screw SCREW 6.5MMX25MM  [employer] [id number] Left 1 Implanted  STEM FEMORAL SZ5 HIGH ACTIS - [id number] Stem STEM FEMORAL SZ5 HIGH ACTIS  [employer] [id number] Left 1 Implanted  SROM M HEAD PLUS 1.5 - [id number] Hips SROM M HEAD PLUS 1.5  [employer] [id number] Left 1 Implanted

## 2024-01-09 NOTE — Discharge Instructions (Signed)

## 2024-01-10 ENCOUNTER — Telehealth: Payer: Self-pay | Admitting: Orthopaedic Surgery

## 2024-01-10 ENCOUNTER — Encounter (HOSPITAL_COMMUNITY): Payer: Self-pay | Admitting: Orthopaedic Surgery

## 2024-01-10 DIAGNOSIS — Z8249 Family history of ischemic heart disease and other diseases of the circulatory system: Secondary | ICD-10-CM | POA: Diagnosis not present

## 2024-01-10 DIAGNOSIS — M25552 Pain in left hip: Secondary | ICD-10-CM | POA: Diagnosis present

## 2024-01-10 DIAGNOSIS — Z79890 Hormone replacement therapy (postmenopausal): Secondary | ICD-10-CM | POA: Diagnosis not present

## 2024-01-10 DIAGNOSIS — Z79899 Other long term (current) drug therapy: Secondary | ICD-10-CM | POA: Diagnosis not present

## 2024-01-10 DIAGNOSIS — M2170 Unequal limb length (acquired), unspecified site: Secondary | ICD-10-CM | POA: Diagnosis present

## 2024-01-10 DIAGNOSIS — Z87891 Personal history of nicotine dependence: Secondary | ICD-10-CM | POA: Diagnosis not present

## 2024-01-10 DIAGNOSIS — M1612 Unilateral primary osteoarthritis, left hip: Secondary | ICD-10-CM | POA: Diagnosis present

## 2024-01-10 DIAGNOSIS — E039 Hypothyroidism, unspecified: Secondary | ICD-10-CM | POA: Diagnosis present

## 2024-01-10 DIAGNOSIS — I10 Essential (primary) hypertension: Secondary | ICD-10-CM | POA: Diagnosis present

## 2024-01-10 DIAGNOSIS — Z9842 Cataract extraction status, left eye: Secondary | ICD-10-CM | POA: Diagnosis not present

## 2024-01-10 DIAGNOSIS — Z9841 Cataract extraction status, right eye: Secondary | ICD-10-CM | POA: Diagnosis not present

## 2024-01-10 MED ORDER — PROMETHAZINE (PHENERGAN) 6.25MG IN NS 50ML IVPB
6.2500 mg | Freq: Three times a day (TID) | INTRAVENOUS | Status: DC | PRN
Start: 2024-01-10 — End: 2024-01-11
  Filled 2024-01-10: qty 50

## 2024-01-10 MED ORDER — SODIUM CHLORIDE 0.9 % IV SOLN
INTRAVENOUS | Status: DC
Start: 2024-01-10 — End: 2024-01-11

## 2024-01-10 NOTE — Anesthesia Postprocedure Evaluation (Signed)
 Anesthesia Post Note  Patient: Valerie Reese  Procedure(s) Performed: LEFT TOTAL HIP ARTHROPLASTY ANTERIOR APPROACH (Left: Hip)     Patient location during evaluation: PACU Anesthesia Type: MAC and Spinal Level of consciousness: awake and alert Pain management: pain level controlled Vital Signs Assessment: post-procedure vital signs reviewed and stable Respiratory status: spontaneous breathing, nonlabored ventilation, respiratory function stable and patient connected to nasal cannula oxygen Cardiovascular status: stable and blood pressure returned to baseline Postop Assessment: no apparent nausea or vomiting Anesthetic complications: no   No notable events documented.  Last Vitals:  Vitals:   01/09/24 2152 01/10/24 0506  BP: (!) 163/81 123/66  Pulse: 71 63  Resp: (!) 21 18  Temp: 36.6 C 36.6 C  SpO2: 97% 97%    Last Pain:  Vitals:   01/10/24 0506  TempSrc: Oral  PainSc: 2                  Mariann Barter

## 2024-01-10 NOTE — Plan of Care (Signed)
  Problem: Education: Goal: Knowledge of General Education information will improve Description: Including pain rating scale, medication(s)/side effects and non-pharmacologic comfort measures Outcome: Progressing   Problem: Health Behavior/Discharge Planning: Goal: Ability to manage health-related needs will improve Outcome: Progressing   Problem: Activity: Goal: Risk for activity intolerance will decrease Outcome: Progressing   Problem: Nutrition: Goal: Adequate nutrition will be maintained Outcome: Progressing   Problem: Coping: Goal: Level of anxiety will decrease Outcome: Progressing   Problem: Elimination: Goal: Will not experience complications related to bowel motility Outcome: Progressing Goal: Will not experience complications related to urinary retention Outcome: Progressing   Problem: Safety: Goal: Ability to remain free from injury will improve Outcome: Progressing   Problem: Pain Managment: Goal: General experience of comfort will improve and/or be controlled Outcome: Progressing   Problem: Skin Integrity: Goal: Risk for impaired skin integrity will decrease Outcome: Progressing

## 2024-01-10 NOTE — Care Management Obs Status (Signed)
 MEDICARE OBSERVATION STATUS NOTIFICATION   Patient Details  Name: Valerie Reese MRN: [id number] Date of Birth: October 30, 1946   Medicare Observation Status Notification Given:  Yes    Epifanio Lesches, RN 01/10/2024, 2:26 PM

## 2024-01-10 NOTE — Telephone Encounter (Signed)
 Received call from Roseanne Reno with Utilization Review needing to change patient status to inpatient.   The number to contact Traci is [phone]

## 2024-01-10 NOTE — Plan of Care (Signed)

## 2024-01-10 NOTE — Progress Notes (Signed)
 Physical Therapy Treatment Patient Details Name: Valerie Reese MRN: [id number] DOB: 02/03/1946 Today's Date: 01/10/2024   History of Present Illness 78 y.o. female presents to [facility] hospital on 01/09/2024 for elective L THA. PMH includes HLD, HTN, lung nodules.    PT Comments  Pt tolerates treatment well, progressing to ambulation in the hallway. Pt does appear to have some deficits in memory, with poor recall of standing with PT yesterday evening. PT provides education on surgical hip exercise packet. Pt will benefit from further gait and stair training prior to discharge home as she has 2 steps to enter her house. Pt reports her brother will likely stay with her for a few days at the time of discharge.    If plan is discharge home, recommend the following: A little help with walking and/or transfers;A little help with bathing/dressing/bathroom;Assistance with cooking/housework;Assist for transportation;Help with stairs or ramp for entrance   Can travel by private vehicle        Equipment Recommendations  Rolling walker (2 wheels);BSC/3in1    Recommendations for Other Services       Precautions / Restrictions Precautions Precautions: Fall Recall of Precautions/Restrictions: Impaired Precaution/Restrictions Comments: direct anterior THA Restrictions Weight Bearing Restrictions Per Provider Order: Yes LLE Weight Bearing Per Provider Order: Weight bearing as tolerated     Mobility  Bed Mobility Overal bed mobility: Needs Assistance Bed Mobility: Supine to Sit     Supine to sit: Supervision          Transfers Overall transfer level: Needs assistance Equipment used: Rolling walker (2 wheels) Transfers: Sit to/from Stand Sit to Stand: Contact guard assist                Ambulation/Gait Ambulation/Gait assistance: Contact guard assist Gait Distance (Feet): 100 Feet Assistive device: Rolling walker (2 wheels) Gait Pattern/deviations: Step-to pattern Gait  velocity: reduced Gait velocity interpretation: <1.8 ft/sec, indicate of risk for recurrent falls   General Gait Details: slowed step-to gait, reduced step length and reduced stance time on LLE   Stairs             Wheelchair Mobility     Tilt Bed    Modified Rankin (Stroke Patients Only)       Balance Overall balance assessment: Needs assistance Sitting-balance support: No upper extremity supported, Feet supported Sitting balance-Leahy Scale: Good     Standing balance support: Bilateral upper extremity supported, Reliant on assistive device for balance Standing balance-Leahy Scale: Poor                              Communication Communication Communication: No apparent difficulties  Cognition Arousal: Alert Behavior During Therapy: WFL for tasks assessed/performed   PT - Cognitive impairments: Memory                         Following commands: Intact      Cueing Cueing Techniques: Verbal cues  Exercises Other Exercises Other Exercises: PT provides education on surgical hip exercise packet    General Comments General comments (skin integrity, edema, etc.): VSS on RA      Pertinent Vitals/Pain Pain Assessment Pain Assessment: Faces Faces Pain Scale: Hurts even more Pain Location: L hip Pain Descriptors / Indicators: Sore Pain Intervention(s): Monitored during session    Home Living  Prior Function            PT Goals (current goals can now be found in the care plan section) Acute Rehab PT Goals Patient Stated Goal: to return to walking her dogs Progress towards PT goals: Progressing toward goals    Frequency    7X/week      PT Plan      Co-evaluation              AM-PAC PT "6 Clicks" Mobility   Outcome Measure  Help needed turning from your back to your side while in a flat bed without using bedrails?: A Little Help needed moving from lying on your back to sitting on  the side of a flat bed without using bedrails?: A Little Help needed moving to and from a bed to a chair (including a wheelchair)?: A Little Help needed standing up from a chair using your arms (e.g., wheelchair or bedside chair)?: A Little Help needed to walk in hospital room?: A Little Help needed climbing 3-5 steps with a railing? : A Little 6 Click Score: 18    End of Session Equipment Utilized During Treatment: Gait belt Activity Tolerance: Patient tolerated treatment well Patient left: in chair;with call bell/phone within reach;with chair alarm set Nurse Communication: Mobility status PT Visit Diagnosis: Other abnormalities of gait and mobility (R26.89);Muscle weakness (generalized) (M62.81);Other symptoms and signs involving the nervous system (R29.898)     Time: 7829-5621 PT Time Calculation (min) (ACUTE ONLY): 23 min  Charges:    $Gait Training: 8-22 mins $Therapeutic Exercise: 8-22 mins PT General Charges $$ ACUTE PT VISIT: 1 Visit                     Arlyss Gandy, PT, DPT Acute Rehabilitation Office [phone]    Arlyss Gandy 01/10/2024, 9:42 AM

## 2024-01-10 NOTE — Progress Notes (Signed)
 Physical Therapy Treatment Patient Details Name: Valerie Reese MRN: [id number] DOB: 07-Jun-1946 Today's Date: 01/10/2024   History of Present Illness 78 y.o. female presents to [facility] hospital on 01/09/2024 for elective L THA. PMH includes HLD, HTN, lung nodules.    PT Comments  Pt tolerates treatment well, ambulating for increased distances and initiating stair training despite continued reports of nausea and vomiting. Pt has still been unable to keep food down since surgery, PT provides encouragement for attempts at eating saltine crackers in small amounts. PT provides reinforcement of HEP education to pt's family. Pt is progressing well, PT will continue to follow.   If plan is discharge home, recommend the following: A little help with walking and/or transfers;A little help with bathing/dressing/bathroom;Assistance with cooking/housework;Assist for transportation;Help with stairs or ramp for entrance   Can travel by private vehicle        Equipment Recommendations  Rolling walker (2 wheels);BSC/3in1    Recommendations for Other Services       Precautions / Restrictions Precautions Precautions: Fall Recall of Precautions/Restrictions: Impaired Precaution/Restrictions Comments: direct anterior THA Restrictions Weight Bearing Restrictions Per Provider Order: Yes LLE Weight Bearing Per Provider Order: Weight bearing as tolerated     Mobility  Bed Mobility Overal bed mobility: Needs Assistance Bed Mobility: Sit to Supine     Supine to sit: Supervision Sit to supine: Min assist   General bed mobility comments: minA for LLE    Transfers Overall transfer level: Needs assistance Equipment used: Rolling walker (2 wheels) Transfers: Sit to/from Stand Sit to Stand: Contact guard assist                Ambulation/Gait Ambulation/Gait assistance: Supervision Gait Distance (Feet): 200 Feet Assistive device: Rolling walker (2 wheels) Gait Pattern/deviations: Step-through  pattern Gait velocity: reduced Gait velocity interpretation: <1.8 ft/sec, indicate of risk for recurrent falls   General Gait Details: slowed step-through gait   Stairs Stairs: Yes Stairs assistance: Min assist Stair Management: No rails, Step to pattern, Forwards (unilateral hand hold) Number of Stairs: 3     Wheelchair Mobility     Tilt Bed    Modified Rankin (Stroke Patients Only)       Balance Overall balance assessment: Needs assistance Sitting-balance support: No upper extremity supported, Feet supported Sitting balance-Leahy Scale: Good     Standing balance support: Single extremity supported, Reliant on assistive device for balance Standing balance-Leahy Scale: Poor                              Communication Communication Communication: No apparent difficulties  Cognition Arousal: Alert Behavior During Therapy: WFL for tasks assessed/performed   PT - Cognitive impairments: Memory                       PT - Cognition Comments: brother reports pt likely with baseline dementia Following commands: Intact      Cueing Cueing Techniques: Verbal cues  Exercises Other Exercises Other Exercises: PT provides education on surgical hip exercise packet, reinforced for pt's brother and sister-in-law    General Comments General comments (skin integrity, edema, etc.): VSS on RA, pt with continued nausea and does vomit when ambulating in hallway. PT encourages attempts at eating, starting with small bites of crackers      Pertinent Vitals/Pain Pain Assessment Pain Assessment: Faces Faces Pain Scale: Hurts little more Pain Location: L hip Pain Descriptors / Indicators: Sore Pain Intervention(s): Monitored  during session    Home Living                          Prior Function            PT Goals (current goals can now be found in the care plan section) Acute Rehab PT Goals Patient Stated Goal: to return to walking her  dogs Progress towards PT goals: Progressing toward goals    Frequency    7X/week      PT Plan      Co-evaluation              AM-PAC PT "6 Clicks" Mobility   Outcome Measure  Help needed turning from your back to your side while in a flat bed without using bedrails?: A Little Help needed moving from lying on your back to sitting on the side of a flat bed without using bedrails?: A Little Help needed moving to and from a bed to a chair (including a wheelchair)?: A Little Help needed standing up from a chair using your arms (e.g., wheelchair or bedside chair)?: A Little Help needed to walk in hospital room?: A Little Help needed climbing 3-5 steps with a railing? : A Little 6 Click Score: 18    End of Session Equipment Utilized During Treatment: Gait belt Activity Tolerance: Patient tolerated treatment well Patient left: in bed;with bed alarm set;with call bell/phone within reach;with family/visitor present Nurse Communication: Mobility status PT Visit Diagnosis: Other abnormalities of gait and mobility (R26.89);Muscle weakness (generalized) (M62.81);Other symptoms and signs involving the nervous system (R29.898)     Time: 1610-9604 PT Time Calculation (min) (ACUTE ONLY): 36 min  Charges:    $Gait Training: 8-22 mins $Therapeutic Exercise: 8-22 mins PT General Charges $$ ACUTE PT VISIT: 1 Visit                     Arlyss Gandy, PT, DPT Acute Rehabilitation Office [phone]    Arlyss Gandy 01/10/2024, 2:04 PM

## 2024-01-10 NOTE — Progress Notes (Signed)
 Subjective: 1 Day Post-Op Procedure(s) (LRB): LEFT TOTAL HIP ARTHROPLASTY ANTERIOR APPROACH (Left) Patient reports pain as mild.  C/o nausea, but nothing more.    Objective: Vital signs in last 24 hours: Temp:  [97.5 F (36.4 C)-98 F (36.7 C)] 97.7 F (36.5 C) (02/25 0717) Pulse Rate:  [55-81] 68 (02/25 0717) Resp:  [13-22] 18 (02/25 0506) BP: (111-185)/(66-99) 127/75 (02/25 0717) SpO2:  [97 %-100 %] 98 % (02/25 0717) Weight:  [56 kg] 56 kg (02/24 1009)  Intake/Output from previous day: 02/24 0701 - 02/25 0700 In: 1380 [P.O.:480; I.V.:500; IV Piggyback:400] Out: 650 [Urine:450; Blood:200] Intake/Output this shift: No intake/output data recorded.  Recent Labs    01/09/24 1053  HGB 11.9*   Recent Labs    01/09/24 1053  HCT 35.0*   Recent Labs    01/09/24 1053  NA 133*  K 3.8  CL 98  BUN 9  CREATININE 0.70  GLUCOSE 90   No results for input(s): "LABPT", "INR" in the last 72 hours.  Neurologically intact Neurovascular intact Sensation intact distally Intact pulses distally Dorsiflexion/Plantar flexion intact Incision: dressing C/D/I No cellulitis present Compartment soft   Assessment/Plan: 1 Day Post-Op Procedure(s) (LRB): LEFT TOTAL HIP ARTHROPLASTY ANTERIOR APPROACH (Left) Advance diet Up with therapy D/C IV fluids WBAT LLE Anticipate d/c home with hhpt tomorrow       Cristie Hem 01/10/2024, 8:45 AM

## 2024-01-10 NOTE — Care Plan (Signed)
 [facility] office RNCM spoke with patient today in her room and explained that per Dr. Roda Shutters and his PA, she would be staying overnight 1 more night to work with therapy and get nausea/medications under control. Spoke with Selena Batten, her bedside RN as well as Marylene Land, RNCM on floor. Updated patient's brother as well that Dr. Roda Shutters and staff feel that d/c tomorrow is expectation. Loraine Leriche, patient's brother thanked for call.

## 2024-01-10 NOTE — Progress Notes (Signed)
    Durable Medical Equipment  (From admission, onward)           Start     Ordered   01/10/24 1429  DME Bedside commode  Once       Comments: Confine to one room  Question:  Patient needs a bedside commode to treat with the following condition  Answer:  History of hip replacement   01/10/24 1429   01/09/24 1718  DME Walker rolling  Once       Question:  Patient needs a walker to treat with the following condition  Answer:  History of hip replacement   01/09/24 1717   01/09/24 1718  DME 3 n 1  Once        01/09/24 1717

## 2024-01-10 NOTE — Progress Notes (Signed)
    Durable Medical Equipment  (From admission, onward)           Start     Ordered   01/10/24 1602  DME Walker rolling  Once       Comments: Gilmer Mor is no longer sufficient,patient condition worsening and the pt needs a rolling walker  Question:  Patient needs a walker to treat with the following condition  Answer:  History of hip replacement   01/10/24 1603   01/10/24 1429  DME Bedside commode  Once       Comments: Confine to one room  Question:  Patient needs a bedside commode to treat with the following condition  Answer:  History of hip replacement   01/10/24 1429   01/09/24 1718  DME 3 n 1  Once        01/09/24 1717

## 2024-01-11 ENCOUNTER — Other Ambulatory Visit: Payer: Self-pay | Admitting: Physician Assistant

## 2024-01-11 MED ORDER — TRAMADOL HCL 50 MG PO TABS: 50.0000 mg | ORAL_TABLET | Freq: Four times a day (QID) | ORAL | 0 refills | Status: AC | PRN

## 2024-01-11 MED ORDER — TRAMADOL HCL 50 MG PO TABS: 50.0000 mg | ORAL_TABLET | Freq: Four times a day (QID) | ORAL | Status: DC | PRN

## 2024-01-11 NOTE — Telephone Encounter (Signed)
 Spoke to [place] and gave order

## 2024-01-11 NOTE — Progress Notes (Signed)
 Physical Therapy Treatment Patient Details Name: Valerie Reese MRN: [id number] DOB: November 21, 1945 Today's Date: 01/11/2024   History of Present Illness 78 y.o. female presents to [facility] hospital on 01/09/2024 for elective L THA. PMH includes HLD, HTN, lung nodules.    PT Comments  Pt tolerates treatment well, with improved bed mobility quality. Pt remains able to ambulate for household distances at this time. Pt continues to demonstrate memory deficits, her brother and sister-in-law will be available at the time of discharge to supervise and provide assistance as necessary. Pt is able to perform all mobility necessary to return home. PT will continue to follow until discharge is complete or all goals are met.   If plan is discharge home, recommend the following: A little help with walking and/or transfers;A little help with bathing/dressing/bathroom;Assistance with cooking/housework;Assist for transportation;Help with stairs or ramp for entrance   Can travel by private vehicle        Equipment Recommendations  Rolling walker (2 wheels);BSC/3in1    Recommendations for Other Services       Precautions / Restrictions Precautions Precautions: Fall Recall of Precautions/Restrictions: Impaired Precaution/Restrictions Comments: direct anterior THA Restrictions Weight Bearing Restrictions Per Provider Order: Yes LLE Weight Bearing Per Provider Order: Weight bearing as tolerated     Mobility  Bed Mobility Overal bed mobility: Needs Assistance Bed Mobility: Supine to Sit     Supine to sit: Supervision          Transfers Overall transfer level: Needs assistance Equipment used: Rolling walker (2 wheels) Transfers: Sit to/from Stand Sit to Stand: Contact guard assist                Ambulation/Gait Ambulation/Gait assistance: Supervision Gait Distance (Feet): 200 Feet Assistive device: Rolling walker (2 wheels) Gait Pattern/deviations: Step-through pattern Gait velocity:  reduced Gait velocity interpretation: <1.8 ft/sec, indicate of risk for recurrent falls   General Gait Details: steady step-through gait   Stairs Stairs: Yes Stairs assistance: Min assist Stair Management: No rails, Forwards, Step to pattern Number of Stairs: 3 General stair comments: cues to ascend with RLE and descend with LLE. Pt with poor recall of these cues from yesterday.   Wheelchair Mobility     Tilt Bed    Modified Rankin (Stroke Patients Only)       Balance Overall balance assessment: Needs assistance Sitting-balance support: No upper extremity supported, Feet supported Sitting balance-Leahy Scale: Good     Standing balance support: Single extremity supported, Reliant on assistive device for balance Standing balance-Leahy Scale: Poor                              Communication Communication Communication: No apparent difficulties  Cognition Arousal: Alert Behavior During Therapy: WFL for tasks assessed/performed   PT - Cognitive impairments: Memory                       PT - Cognition Comments: brother reports pt likely with baseline dementia. Pt with poor recall of specific events from yesterday, is unable to report exactly what was done with PT. Pt also doesn't recall that her sister in law visited her long with her brother yesterday Following commands: Intact      Cueing Cueing Techniques: Verbal cues  Exercises      General Comments General comments (skin integrity, edema, etc.): VSS on RA, pt reports nausea upon completion of mobility at end of session  Pertinent Vitals/Pain Pain Assessment Pain Assessment: Faces Faces Pain Scale: Hurts little more Pain Location: L hip Pain Descriptors / Indicators: Sore Pain Intervention(s): Monitored during session    Home Living                          Prior Function            PT Goals (current goals can now be found in the care plan section) Acute Rehab PT  Goals Patient Stated Goal: to return to walking her dogs Progress towards PT goals: Progressing toward goals    Frequency    7X/week      PT Plan      Co-evaluation              AM-PAC PT "6 Clicks" Mobility   Outcome Measure  Help needed turning from your back to your side while in a flat bed without using bedrails?: A Little Help needed moving from lying on your back to sitting on the side of a flat bed without using bedrails?: A Little Help needed moving to and from a bed to a chair (including a wheelchair)?: A Little Help needed standing up from a chair using your arms (e.g., wheelchair or bedside chair)?: A Little Help needed to walk in hospital room?: A Little Help needed climbing 3-5 steps with a railing? : A Little 6 Click Score: 18    End of Session Equipment Utilized During Treatment: Gait belt Activity Tolerance: Patient tolerated treatment well Patient left: in chair;with call bell/phone within reach;with chair alarm set Nurse Communication: Mobility status PT Visit Diagnosis: Other abnormalities of gait and mobility (R26.89);Muscle weakness (generalized) (M62.81);Other symptoms and signs involving the nervous system (R29.898)     Time: 9147-8295 PT Time Calculation (min) (ACUTE ONLY): 26 min  Charges:    $Gait Training: 23-37 mins PT General Charges $$ ACUTE PT VISIT: 1 Visit                     Arlyss Gandy, PT, DPT Acute Rehabilitation Office [phone]    Arlyss Gandy 01/11/2024, 9:20 AM

## 2024-01-11 NOTE — Discharge Summary (Signed)
 Patient ID: Valerie Reese MRN: [id number] DOB/AGE: January 07, 1946 78 y.o.  Admit date: 01/09/2024 Discharge date: 01/11/2024  Admission Diagnoses:  Principal Problem:   Primary osteoarthritis of left hip Active Problems:   Status post total replacement of left hip   Discharge Diagnoses:  Same  Past Medical History:  Diagnosis Date   Allergic rhinitis    Hypertension    Hypothyroid    age 31   Hypothyroidism    Memory difficulty    pt reports she functions normally with some memory problems- one doctor stated that she may have a mild dementia    Surgeries: Procedure(s): LEFT TOTAL HIP ARTHROPLASTY ANTERIOR APPROACH on 01/09/2024   Consultants:   Discharged Condition: Improved  Hospital Course: Valerie Reese is an 78 y.o. female who was admitted 01/09/2024 for operative treatment ofPrimary osteoarthritis of left hip. Patient has severe unremitting pain that affects sleep, daily activities, and work/hobbies. After pre-op clearance the patient was taken to the operating room on 01/09/2024 and underwent  Procedure(s): LEFT TOTAL HIP ARTHROPLASTY ANTERIOR APPROACH.    Patient was given perioperative antibiotics:  Anti-infectives (From admission, onward)    Start     Dose/Rate Route Frequency Ordered Stop   01/09/24 1815  ceFAZolin (ANCEF) IVPB 2g/100 mL premix        2 g 200 mL/hr over 30 Minutes Intravenous Every 6 hours 01/09/24 1717 01/10/24 0224   01/09/24 1219  vancomycin (VANCOCIN) powder  Status:  Discontinued          As needed 01/09/24 1219 01/09/24 1429   01/09/24 1000  ceFAZolin (ANCEF) IVPB 2g/100 mL premix        2 g 200 mL/hr over 30 Minutes Intravenous On call to O.R. 01/09/24 0958 01/09/24 1257        Patient was given sequential compression devices, early ambulation, and chemoprophylaxis to prevent DVT.  Patient benefited maximally from hospital stay and there were no complications.    Recent vital signs: Patient Vitals for the past 24 hrs:  BP Temp  Temp src Pulse Resp SpO2  01/11/24 0751 139/78 98.1 F (36.7 C) Oral 83 18 100 %  01/11/24 0451 (!) 140/74 98.5 F (36.9 C) Oral 74 16 97 %  01/10/24 2040 (!) 145/83 98.5 F (36.9 C) Oral 89 16 98 %  01/10/24 1510 128/71 97.8 F (36.6 C) Oral 68 -- 99 %     Recent laboratory studies:  Recent Labs    01/09/24 1053  HGB 11.9*  HCT 35.0*  NA 133*  K 3.8  CL 98  BUN 9  CREATININE 0.70  GLUCOSE 90     Discharge Medications:   Allergies as of 01/11/2024   No Known Allergies      Medication List     STOP taking these medications    ibuprofen 200 MG tablet Commonly known as: ADVIL   methocarbamol 750 MG tablet Commonly known as: Robaxin-750   oxyCODONE-acetaminophen 5-325 MG tablet Commonly known as: Percocet       TAKE these medications    aspirin EC 81 MG tablet Take 1 tablet (81 mg total) by mouth 2 (two) times daily. To be taken after surgery to prevent blood clots   atorvastatin 10 MG tablet Commonly known as: LIPITOR Take 1 tablet (10 mg total) by mouth daily.   CoQ10 100 MG Caps Take 100 mg by mouth daily.   docusate sodium 100 MG capsule Commonly known as: Colace Take 1 capsule (100 mg total) by mouth daily  as needed.   donepezil 5 MG tablet Commonly known as: ARICEPT TAKE 1 TABLET BY MOUTH AT BEDTIME   levothyroxine 112 MCG tablet Commonly known as: SYNTHROID TAKE 1 TABLET BY MOUTH ONCE DAILY BEFORE BREAKFAST   losartan 25 MG tablet Commonly known as: COZAAR Take 1 tablet by mouth once daily   ondansetron 4 MG tablet Commonly known as: Zofran Take 1 tablet (4 mg total) by mouth every 8 (eight) hours as needed for nausea or vomiting.   traMADol 50 MG tablet Commonly known as: ULTRAM Take 1-2 tablets (50-100 mg total) by mouth every 6 (six) hours as needed.   Vitamin D3 Ultra Strength 125 MCG (5000 UT) capsule Generic drug: Cholecalciferol Take 5,000 Units by mouth daily.               Durable Medical Equipment  (From  admission, onward)           Start     Ordered   01/10/24 1602  DME Walker rolling  Once       Comments: Gilmer Mor is no longer sufficient,patient condition worsening and the pt needs a rolling walker  Question:  Patient needs a walker to treat with the following condition  Answer:  History of hip replacement   01/10/24 1603   01/10/24 1429  DME Bedside commode  Once       Comments: Confine to one room  Question:  Patient needs a bedside commode to treat with the following condition  Answer:  History of hip replacement   01/10/24 1429   01/09/24 1718  DME 3 n 1  Once        01/09/24 1717            Diagnostic Studies: DG Pelvis Portable Result Date: 01/09/2024 CLINICAL DATA:  Postop left hip arthroplasty EXAM: PORTABLE PELVIS 1-2 VIEWS COMPARISON:  None Available. FINDINGS: Single view of the pelvis was obtained with the patient in LPO positioning. Left hip arthroplasty is identified in the expected position without evidence of acute complication. Postsurgical changes in the overlying soft tissues. IMPRESSION: 1. Unremarkable left hip arthroplasty. Electronically Signed   By: Sharlet Salina M.D.   On: 01/09/2024 18:50   DG HIP UNILAT WITH PELVIS 1V LEFT Result Date: 01/09/2024 CLINICAL DATA:  Left hip arthroplasty. EXAM: DG HIP (WITH OR WITHOUT PELVIS) 1V*L* COMPARISON:  Left hip radiograph dated 11/23/2023. FINDINGS: Two intraoperative fluoroscopic spot images provided. The total fluoroscopic time is 29.2 seconds with a cumulative air Karma of 2.82 mGy. There is a total left hip arthroplasty. IMPRESSION: Intraoperative fluoroscopic spot images for total left hip arthroplasty. Electronically Signed   By: Elgie Collard M.D.   On: 01/09/2024 18:36   DG C-Arm 1-60 Min-No Report Result Date: 01/09/2024 Fluoroscopy was utilized by the requesting physician.  No radiographic interpretation.    Disposition: Discharge disposition: 01-Home or Self Care          Follow-up Information      Cristie Hem, PA-C. Schedule an appointment as soon as possible for a visit in 2 week(s).   Specialty: Orthopedic Surgery Contact information: [address] [place] [address] [phone]         Ngetich, Donalee Citrin, NP Follow up.   Specialty: Family Medicine Contact information: [address] [place] [address] [phone]         [facility], [facility] Follow up.   Specialty: Home Health Services Why: home health services will be provided by [facility]  Health, start of care within 48 hours post discharge Contact information: [address] [place] [address] [phone]                  Signed: Cristie Hem 01/11/2024, 8:05 AM

## 2024-01-11 NOTE — Progress Notes (Signed)
 Subjective: 2 Days Post-Op Procedure(s) (LRB): LEFT TOTAL HIP ARTHROPLASTY ANTERIOR APPROACH (Left) Patient reports pain as mild.  Feeling better this am.  No complaints of nausea.  Tells me she has been eating  Objective: Vital signs in last 24 hours: Temp:  [97.8 F (36.6 C)-98.5 F (36.9 C)] 98.1 F (36.7 C) (02/26 0751) Pulse Rate:  [68-89] 83 (02/26 0751) Resp:  [16-18] 18 (02/26 0751) BP: (128-145)/(71-83) 139/78 (02/26 0751) SpO2:  [97 %-100 %] 100 % (02/26 0751)  Intake/Output from previous day: 02/25 0701 - 02/26 0700 In: 36.7 [I.V.:36.7] Out: -  Intake/Output this shift: No intake/output data recorded.  Recent Labs    01/09/24 1053  HGB 11.9*   Recent Labs    01/09/24 1053  HCT 35.0*   Recent Labs    01/09/24 1053  NA 133*  K 3.8  CL 98  BUN 9  CREATININE 0.70  GLUCOSE 90   No results for input(s): "LABPT", "INR" in the last 72 hours.  Neurologically intact Neurovascular intact Sensation intact distally Intact pulses distally Dorsiflexion/Plantar flexion intact Incision: dressing C/D/I No cellulitis present Compartment soft   Assessment/Plan: 2 Days Post-Op Procedure(s) (LRB): LEFT TOTAL HIP ARTHROPLASTY ANTERIOR APPROACH (Left) Advance diet Up with therapy Discharge home with home health as long as she is able to keep food down and is cleared by PT I have d/c oxy and robaxin and have added tramadol WBAT LLE Continue iv fluids      Cristie Hem 01/11/2024, 8:02 AM

## 2024-01-11 NOTE — TOC Transition Note (Addendum)
 Transition of Care [facility]) - Discharge Note   Patient Details  Name: Valerie Reese MRN: [id number] Date of Birth: 01/10/1946  Transition of [facility] CM/SW Contact:  Epifanio Lesches, RN Phone Number: 01/11/2024, 10:16 AM   Clinical Narrative:    Patient will DC to: home Anticipated DC date: 01/11/2024 Family notified: yes Transport by: car  - S/p L TOTAL HIP ARTHROPLASTY on 01/09/2024   Per MD patient ready for DC today. RN, patient, patient's family, and [facility]  notified of DC. Family to assist with care once home. DME ( RW and BSC) delivered to room by [employer].  Pt without RX med concerns or transportation issues.  Post hospital f/u noted on AVS.  RNCM will sign off for now as intervention is no longer needed. Please consult [place] again if new needs arise.    Final next level of care: Home w Home Health Services Barriers to Discharge: No Barriers Identified   Patient Goals and CMS Choice     Choice offered to / list presented to : Patient      Discharge Placement                       Discharge Plan and Services Additional resources added to the After Visit Summary for     Discharge Planning Services: CM Consult            DME Arranged: Bedside commode, Walker rolling DME Agency: [employer] Date DME Agency Contacted: 01/10/24 Time DME Agency Contacted: 1435 Representative spoke with at DME Agency: Ian Malkin HH Arranged: PT          Social Drivers of Health (SDOH) Interventions SDOH Screenings   Food Insecurity: No Food Insecurity (01/09/2024)  Housing: Low Risk  (01/09/2024)  Transportation Needs: No Transportation Needs (01/09/2024)  Utilities: Not At Risk (01/09/2024)  Alcohol Screen: Low Risk  (04/06/2023)  Depression (PHQ2-9): Low Risk  (08/08/2023)  Financial Resource Strain: Patient Declined (04/06/2023)  Physical Activity: Sufficiently Active (04/06/2023)  Social Connections: Unknown (01/09/2024)  Stress: No Stress Concern  Present (04/06/2023)  Tobacco Use: Medium Risk (01/09/2024)     Readmission Risk Interventions     No data to display

## 2024-01-13 DIAGNOSIS — E039 Hypothyroidism, unspecified: Secondary | ICD-10-CM | POA: Diagnosis not present

## 2024-01-13 DIAGNOSIS — R918 Other nonspecific abnormal finding of lung field: Secondary | ICD-10-CM | POA: Diagnosis not present

## 2024-01-13 DIAGNOSIS — Z87891 Personal history of nicotine dependence: Secondary | ICD-10-CM | POA: Diagnosis not present

## 2024-01-13 DIAGNOSIS — K5900 Constipation, unspecified: Secondary | ICD-10-CM | POA: Diagnosis not present

## 2024-01-13 DIAGNOSIS — Z9842 Cataract extraction status, left eye: Secondary | ICD-10-CM | POA: Diagnosis not present

## 2024-01-13 DIAGNOSIS — G8929 Other chronic pain: Secondary | ICD-10-CM | POA: Diagnosis not present

## 2024-01-13 DIAGNOSIS — Z96642 Presence of left artificial hip joint: Secondary | ICD-10-CM | POA: Diagnosis not present

## 2024-01-13 DIAGNOSIS — Z471 Aftercare following joint replacement surgery: Secondary | ICD-10-CM | POA: Diagnosis not present

## 2024-01-13 DIAGNOSIS — M25562 Pain in left knee: Secondary | ICD-10-CM | POA: Diagnosis not present

## 2024-01-13 DIAGNOSIS — Z9841 Cataract extraction status, right eye: Secondary | ICD-10-CM | POA: Diagnosis not present

## 2024-01-13 DIAGNOSIS — Z7982 Long term (current) use of aspirin: Secondary | ICD-10-CM | POA: Diagnosis not present

## 2024-01-13 DIAGNOSIS — Z9181 History of falling: Secondary | ICD-10-CM | POA: Diagnosis not present

## 2024-01-13 DIAGNOSIS — J309 Allergic rhinitis, unspecified: Secondary | ICD-10-CM | POA: Diagnosis not present

## 2024-01-13 DIAGNOSIS — I10 Essential (primary) hypertension: Secondary | ICD-10-CM | POA: Diagnosis not present

## 2024-01-16 ENCOUNTER — Other Ambulatory Visit: Payer: Self-pay

## 2024-01-16 ENCOUNTER — Telehealth: Payer: Self-pay | Admitting: *Deleted

## 2024-01-16 ENCOUNTER — Telehealth: Payer: Self-pay

## 2024-01-16 DIAGNOSIS — M1612 Unilateral primary osteoarthritis, left hip: Secondary | ICD-10-CM

## 2024-01-16 NOTE — Telephone Encounter (Signed)
 Patient's brother called this morning and states his sister has a rash covering her entire back, no where else. Wanted to know if it could possibly be from the Tramadol. I told him it could, but also could be laying on her back more than she has probably as well. Any recommendations? I know I usually can tell them to take with OTC Benadryl, but for her, I am not sure. Thank you.

## 2024-01-16 NOTE — Telephone Encounter (Signed)
 I would think would be more than just her back if from benadryl, but maybe not? Did it start when she started the tramadol?  Maybe try a topical benadryl.

## 2024-01-16 NOTE — Telephone Encounter (Signed)
 Patient's brother has [facility] and wants to add on OT as well. Is that okay? Left THA 01/09/2024.

## 2024-01-16 NOTE — Telephone Encounter (Signed)
 sure

## 2024-01-16 NOTE — Telephone Encounter (Signed)
 Might be heat rash if he's sitting or lying down more than usual since the surgery.  Take zyrtec and use 1% hydrocortisone ream.

## 2024-01-17 DIAGNOSIS — G8929 Other chronic pain: Secondary | ICD-10-CM | POA: Diagnosis not present

## 2024-01-17 DIAGNOSIS — M25562 Pain in left knee: Secondary | ICD-10-CM | POA: Diagnosis not present

## 2024-01-17 DIAGNOSIS — I10 Essential (primary) hypertension: Secondary | ICD-10-CM | POA: Diagnosis not present

## 2024-01-17 DIAGNOSIS — Z7982 Long term (current) use of aspirin: Secondary | ICD-10-CM | POA: Diagnosis not present

## 2024-01-17 DIAGNOSIS — Z471 Aftercare following joint replacement surgery: Secondary | ICD-10-CM | POA: Diagnosis not present

## 2024-01-17 DIAGNOSIS — R918 Other nonspecific abnormal finding of lung field: Secondary | ICD-10-CM | POA: Diagnosis not present

## 2024-01-17 DIAGNOSIS — Z96642 Presence of left artificial hip joint: Secondary | ICD-10-CM | POA: Diagnosis not present

## 2024-01-17 DIAGNOSIS — Z87891 Personal history of nicotine dependence: Secondary | ICD-10-CM | POA: Diagnosis not present

## 2024-01-17 DIAGNOSIS — J309 Allergic rhinitis, unspecified: Secondary | ICD-10-CM | POA: Diagnosis not present

## 2024-01-17 DIAGNOSIS — Z9842 Cataract extraction status, left eye: Secondary | ICD-10-CM | POA: Diagnosis not present

## 2024-01-17 DIAGNOSIS — Z9841 Cataract extraction status, right eye: Secondary | ICD-10-CM | POA: Diagnosis not present

## 2024-01-17 DIAGNOSIS — E039 Hypothyroidism, unspecified: Secondary | ICD-10-CM | POA: Diagnosis not present

## 2024-01-17 DIAGNOSIS — Z9181 History of falling: Secondary | ICD-10-CM | POA: Diagnosis not present

## 2024-01-17 DIAGNOSIS — K5900 Constipation, unspecified: Secondary | ICD-10-CM | POA: Diagnosis not present

## 2024-01-18 DIAGNOSIS — Z471 Aftercare following joint replacement surgery: Secondary | ICD-10-CM | POA: Diagnosis not present

## 2024-01-18 DIAGNOSIS — Z7982 Long term (current) use of aspirin: Secondary | ICD-10-CM | POA: Diagnosis not present

## 2024-01-18 DIAGNOSIS — Z87891 Personal history of nicotine dependence: Secondary | ICD-10-CM | POA: Diagnosis not present

## 2024-01-18 DIAGNOSIS — E039 Hypothyroidism, unspecified: Secondary | ICD-10-CM | POA: Diagnosis not present

## 2024-01-18 DIAGNOSIS — M25562 Pain in left knee: Secondary | ICD-10-CM | POA: Diagnosis not present

## 2024-01-18 DIAGNOSIS — Z9181 History of falling: Secondary | ICD-10-CM | POA: Diagnosis not present

## 2024-01-18 DIAGNOSIS — I10 Essential (primary) hypertension: Secondary | ICD-10-CM | POA: Diagnosis not present

## 2024-01-18 DIAGNOSIS — K5900 Constipation, unspecified: Secondary | ICD-10-CM | POA: Diagnosis not present

## 2024-01-18 DIAGNOSIS — Z9841 Cataract extraction status, right eye: Secondary | ICD-10-CM | POA: Diagnosis not present

## 2024-01-18 DIAGNOSIS — Z9842 Cataract extraction status, left eye: Secondary | ICD-10-CM | POA: Diagnosis not present

## 2024-01-18 DIAGNOSIS — J309 Allergic rhinitis, unspecified: Secondary | ICD-10-CM | POA: Diagnosis not present

## 2024-01-18 DIAGNOSIS — R918 Other nonspecific abnormal finding of lung field: Secondary | ICD-10-CM | POA: Diagnosis not present

## 2024-01-18 DIAGNOSIS — Z96642 Presence of left artificial hip joint: Secondary | ICD-10-CM | POA: Diagnosis not present

## 2024-01-18 DIAGNOSIS — G8929 Other chronic pain: Secondary | ICD-10-CM | POA: Diagnosis not present

## 2024-01-19 DIAGNOSIS — Z9181 History of falling: Secondary | ICD-10-CM | POA: Diagnosis not present

## 2024-01-19 DIAGNOSIS — E039 Hypothyroidism, unspecified: Secondary | ICD-10-CM | POA: Diagnosis not present

## 2024-01-19 DIAGNOSIS — G8929 Other chronic pain: Secondary | ICD-10-CM | POA: Diagnosis not present

## 2024-01-19 DIAGNOSIS — Z96642 Presence of left artificial hip joint: Secondary | ICD-10-CM | POA: Diagnosis not present

## 2024-01-19 DIAGNOSIS — Z9842 Cataract extraction status, left eye: Secondary | ICD-10-CM | POA: Diagnosis not present

## 2024-01-19 DIAGNOSIS — I10 Essential (primary) hypertension: Secondary | ICD-10-CM | POA: Diagnosis not present

## 2024-01-19 DIAGNOSIS — K5900 Constipation, unspecified: Secondary | ICD-10-CM | POA: Diagnosis not present

## 2024-01-19 DIAGNOSIS — R918 Other nonspecific abnormal finding of lung field: Secondary | ICD-10-CM | POA: Diagnosis not present

## 2024-01-19 DIAGNOSIS — Z7982 Long term (current) use of aspirin: Secondary | ICD-10-CM | POA: Diagnosis not present

## 2024-01-19 DIAGNOSIS — Z9841 Cataract extraction status, right eye: Secondary | ICD-10-CM | POA: Diagnosis not present

## 2024-01-19 DIAGNOSIS — J309 Allergic rhinitis, unspecified: Secondary | ICD-10-CM | POA: Diagnosis not present

## 2024-01-19 DIAGNOSIS — Z87891 Personal history of nicotine dependence: Secondary | ICD-10-CM | POA: Diagnosis not present

## 2024-01-19 DIAGNOSIS — Z471 Aftercare following joint replacement surgery: Secondary | ICD-10-CM | POA: Diagnosis not present

## 2024-01-19 DIAGNOSIS — M25562 Pain in left knee: Secondary | ICD-10-CM | POA: Diagnosis not present

## 2024-01-20 DIAGNOSIS — M1612 Unilateral primary osteoarthritis, left hip: Secondary | ICD-10-CM | POA: Diagnosis not present

## 2024-01-23 DIAGNOSIS — M25562 Pain in left knee: Secondary | ICD-10-CM | POA: Diagnosis not present

## 2024-01-23 DIAGNOSIS — Z87891 Personal history of nicotine dependence: Secondary | ICD-10-CM | POA: Diagnosis not present

## 2024-01-23 DIAGNOSIS — J309 Allergic rhinitis, unspecified: Secondary | ICD-10-CM | POA: Diagnosis not present

## 2024-01-23 DIAGNOSIS — E039 Hypothyroidism, unspecified: Secondary | ICD-10-CM | POA: Diagnosis not present

## 2024-01-23 DIAGNOSIS — R918 Other nonspecific abnormal finding of lung field: Secondary | ICD-10-CM | POA: Diagnosis not present

## 2024-01-23 DIAGNOSIS — Z9842 Cataract extraction status, left eye: Secondary | ICD-10-CM | POA: Diagnosis not present

## 2024-01-23 DIAGNOSIS — Z471 Aftercare following joint replacement surgery: Secondary | ICD-10-CM | POA: Diagnosis not present

## 2024-01-23 DIAGNOSIS — K5900 Constipation, unspecified: Secondary | ICD-10-CM | POA: Diagnosis not present

## 2024-01-23 DIAGNOSIS — Z96642 Presence of left artificial hip joint: Secondary | ICD-10-CM | POA: Diagnosis not present

## 2024-01-23 DIAGNOSIS — Z7982 Long term (current) use of aspirin: Secondary | ICD-10-CM | POA: Diagnosis not present

## 2024-01-23 DIAGNOSIS — Z9181 History of falling: Secondary | ICD-10-CM | POA: Diagnosis not present

## 2024-01-23 DIAGNOSIS — I10 Essential (primary) hypertension: Secondary | ICD-10-CM | POA: Diagnosis not present

## 2024-01-23 DIAGNOSIS — Z9841 Cataract extraction status, right eye: Secondary | ICD-10-CM | POA: Diagnosis not present

## 2024-01-23 DIAGNOSIS — G8929 Other chronic pain: Secondary | ICD-10-CM | POA: Diagnosis not present

## 2024-01-24 ENCOUNTER — Ambulatory Visit (INDEPENDENT_AMBULATORY_CARE_PROVIDER_SITE_OTHER): Payer: Medicare Other | Admitting: Physician Assistant

## 2024-01-24 ENCOUNTER — Encounter: Payer: Self-pay | Admitting: Physician Assistant

## 2024-01-24 DIAGNOSIS — Z96642 Presence of left artificial hip joint: Secondary | ICD-10-CM

## 2024-01-24 NOTE — Progress Notes (Signed)
 Post-Op Visit Note   Patient: Valerie Reese           Date of Birth: 04-Jun-1946           MRN: [id number] Visit Date: 01/24/2024 PCP: Orpha Bur, MD   Assessment & Plan:  Chief Complaint:  Chief Complaint  Patient presents with   Left Hip - Follow-up    Left total hip arthroplasty 01/09/2024   Visit Diagnoses:  1. Status post total replacement of left hip     Plan: Patient is a pleasant 78 year old female who comes in today 2 weeks status post left total hip replacement 01/09/2024.  She has been doing great.  She is taking only Tylenol for pain.  Initially had trouble with constipation but this has resolved with over-the-counter laxatives/stool softener.  She has been compliant taking a baby aspirin twice daily for DVT prophylaxis.  She has been getting home health physical therapy and is ambulating with a walker and sometimes a cane.  Examination of the left hip reveals a well-healing surgical incision with nylon sutures in place.  No evidence of infection or cellulitis.  She does have a small seroma.  This is nontender.  Calves are soft and nontender.  She is neurovascularly intact distally.  Today, sutures were removed and Steri-Strips applied.  I do not feel she needs any outpatient physical therapy at this time.  We recommended really just walking for the first 6 weeks postop.  She will continue with her baby aspirin twice daily for another 4 weeks.  She will follow-up with [place] in 4 weeks for repeat evaluation and AP pelvis x-rays.  Call with concerns or questions.  Follow-Up Instructions: Return in about 4 weeks (around 02/21/2024).   Orders:  No orders of the defined types were placed in this encounter.  No orders of the defined types were placed in this encounter.   Imaging: No new imaging  PMFS History: Patient Active Problem List   Diagnosis Date Noted   Status post total replacement of left hip 01/09/2024   Primary osteoarthritis of left hip 01/07/2024    Essential hypertension 01/30/2023   Acquired hypothyroidism 01/30/2023   Hyperlipidemia LDL goal <70 01/30/2023   Chronic pain of left knee 01/30/2023   Multiple lung nodules 05/06/2021   Past Medical History:  Diagnosis Date   Allergic rhinitis    Hypertension    Hypothyroid    age 65   Hypothyroidism    Memory difficulty    pt reports she functions normally with some memory problems- one doctor stated that she may have a mild dementia    Family History  Problem Relation Age of Onset   Aneurysm Mother    Fibroids Father    Pulmonary disease Father    Hypertension Father    Hypertension Brother    Lung cancer Maternal Grandfather     Past Surgical History:  Procedure Laterality Date   CATARACT EXTRACTION Bilateral    ECTOPIC PREGNANCY SURGERY     TOTAL HIP ARTHROPLASTY Left 01/09/2024   Procedure: LEFT TOTAL HIP ARTHROPLASTY ANTERIOR APPROACH;  Surgeon: Tarry Kos, MD;  Location: [facility] OR;  Service: Orthopedics;  Laterality: Left;  3-C   Social History   Occupational History   Occupation: Retired  Tobacco Use   Smoking status: Former    Current packs/day: 0.00    Average packs/day: 1 pack/day for 20.0 years (20.0 ttl pk-yrs)    Types: Cigarettes    Start date: 1968  Quit date: 14    Years since quitting: 37.2    Passive exposure: Never   Smokeless tobacco: Never  Vaping Use   Vaping status: Never Used  Substance and Sexual Activity   Alcohol use: Yes    Comment: occasional    Drug use: Never   Sexual activity: Not on file

## 2024-01-27 DIAGNOSIS — Z7982 Long term (current) use of aspirin: Secondary | ICD-10-CM | POA: Diagnosis not present

## 2024-01-27 DIAGNOSIS — Z9181 History of falling: Secondary | ICD-10-CM | POA: Diagnosis not present

## 2024-01-27 DIAGNOSIS — Z9841 Cataract extraction status, right eye: Secondary | ICD-10-CM | POA: Diagnosis not present

## 2024-01-27 DIAGNOSIS — Z87891 Personal history of nicotine dependence: Secondary | ICD-10-CM | POA: Diagnosis not present

## 2024-01-27 DIAGNOSIS — I10 Essential (primary) hypertension: Secondary | ICD-10-CM | POA: Diagnosis not present

## 2024-01-27 DIAGNOSIS — Z96642 Presence of left artificial hip joint: Secondary | ICD-10-CM | POA: Diagnosis not present

## 2024-01-27 DIAGNOSIS — Z9842 Cataract extraction status, left eye: Secondary | ICD-10-CM | POA: Diagnosis not present

## 2024-01-27 DIAGNOSIS — R918 Other nonspecific abnormal finding of lung field: Secondary | ICD-10-CM | POA: Diagnosis not present

## 2024-01-27 DIAGNOSIS — J309 Allergic rhinitis, unspecified: Secondary | ICD-10-CM | POA: Diagnosis not present

## 2024-01-27 DIAGNOSIS — G8929 Other chronic pain: Secondary | ICD-10-CM | POA: Diagnosis not present

## 2024-01-27 DIAGNOSIS — K5900 Constipation, unspecified: Secondary | ICD-10-CM | POA: Diagnosis not present

## 2024-01-27 DIAGNOSIS — Z471 Aftercare following joint replacement surgery: Secondary | ICD-10-CM | POA: Diagnosis not present

## 2024-01-27 DIAGNOSIS — M25562 Pain in left knee: Secondary | ICD-10-CM | POA: Diagnosis not present

## 2024-01-27 DIAGNOSIS — E039 Hypothyroidism, unspecified: Secondary | ICD-10-CM | POA: Diagnosis not present

## 2024-01-30 ENCOUNTER — Telehealth: Payer: Self-pay

## 2024-01-30 NOTE — Telephone Encounter (Signed)
 No I think she should go with her brother for now.

## 2024-01-30 NOTE — Telephone Encounter (Signed)
 Patient called wanting to know when would she be able to go to the grocery store by herself?  Patient had Left THA on 01/09/2024.  CB#  [phone].  Please advise.  Thank you.

## 2024-01-30 NOTE — Telephone Encounter (Signed)
 Called both numbers on chart. No answer. Left a message on both voicemails relaying Dr.Xu's response.

## 2024-02-07 DIAGNOSIS — I10 Essential (primary) hypertension: Secondary | ICD-10-CM | POA: Diagnosis not present

## 2024-02-07 DIAGNOSIS — R413 Other amnesia: Secondary | ICD-10-CM | POA: Diagnosis not present

## 2024-02-09 ENCOUNTER — Other Ambulatory Visit: Payer: Self-pay | Admitting: Family Medicine

## 2024-02-09 DIAGNOSIS — F03B0 Unspecified dementia, moderate, without behavioral disturbance, psychotic disturbance, mood disturbance, and anxiety: Secondary | ICD-10-CM

## 2024-02-15 ENCOUNTER — Telehealth: Payer: Self-pay | Admitting: Orthopaedic Surgery

## 2024-02-15 NOTE — Telephone Encounter (Signed)
 Pt called asking if she is able to drive yet. Pt surgery was 12/2023 and she states she feels great. Please call pt at [phone].

## 2024-02-15 NOTE — Telephone Encounter (Signed)
I left voicemail for patient advising. 

## 2024-02-15 NOTE — Telephone Encounter (Signed)
 I think as long as she is not taking any pain meds/muscle relaxers and car does not have a clutch, that should be fine

## 2024-02-19 DIAGNOSIS — H524 Presbyopia: Secondary | ICD-10-CM | POA: Diagnosis not present

## 2024-02-19 DIAGNOSIS — H2513 Age-related nuclear cataract, bilateral: Secondary | ICD-10-CM | POA: Diagnosis not present

## 2024-02-21 ENCOUNTER — Encounter: Payer: Self-pay | Admitting: Physician Assistant

## 2024-02-21 ENCOUNTER — Other Ambulatory Visit (INDEPENDENT_AMBULATORY_CARE_PROVIDER_SITE_OTHER): Payer: Self-pay

## 2024-02-21 ENCOUNTER — Ambulatory Visit: Admitting: Physician Assistant

## 2024-02-21 DIAGNOSIS — Z96642 Presence of left artificial hip joint: Secondary | ICD-10-CM

## 2024-02-21 NOTE — Progress Notes (Signed)
   Post-Op Visit Note   Patient: Valerie Reese           Date of Birth: 1946/07/03           MRN: [id number] Visit Date: 02/21/2024 PCP: Orpha Bur, MD   Assessment & Plan:  Chief Complaint:  Chief Complaint  Patient presents with   Left Hip - Follow-up    Left total hip arthroplasty 01/09/2024   Visit Diagnoses:  1. Status post total replacement of left hip     Plan: Patient is a pleasant 78 year old female who comes in today 6-week status post left total hip replacement 01/09/2024.  She has been doing great.  No complaints.  She has been compliant taking a baby aspirin twice daily for DVT prophylaxis.  Examination of the left hip reveals painless hip flexion and logroll.  She is neurovascularly intact distally.  At this point, she may discontinue her aspirin from our standpoint.  Continue to advance with activity as tolerated.  Dental prophylaxis reinforced.  Follow-up in 6 weeks for recheck.  Call with concerns or questions.  Follow-Up Instructions: Return in about 6 weeks (around 04/03/2024).   Orders:  Orders Placed This Encounter  Procedures   XR Pelvis 1-2 Views   No orders of the defined types were placed in this encounter.   Imaging: XR Pelvis 1-2 Views Result Date: 02/21/2024 Well-seated prosthesis without complication   PMFS History: Patient Active Problem List   Diagnosis Date Noted   Status post total replacement of left hip 01/09/2024   Primary osteoarthritis of left hip 01/07/2024   Essential hypertension 01/30/2023   Acquired hypothyroidism 01/30/2023   Hyperlipidemia LDL goal <70 01/30/2023   Chronic pain of left knee 01/30/2023   Multiple lung nodules 05/06/2021   Past Medical History:  Diagnosis Date   Allergic rhinitis    Hypertension    Hypothyroid    age 44   Hypothyroidism    Memory difficulty    pt reports she functions normally with some memory problems- one doctor stated that she may have a mild dementia    Family History   Problem Relation Age of Onset   Aneurysm Mother    Fibroids Father    Pulmonary disease Father    Hypertension Father    Hypertension Brother    Lung cancer Maternal Grandfather     Past Surgical History:  Procedure Laterality Date   CATARACT EXTRACTION Bilateral    ECTOPIC PREGNANCY SURGERY     TOTAL HIP ARTHROPLASTY Left 01/09/2024   Procedure: LEFT TOTAL HIP ARTHROPLASTY ANTERIOR APPROACH;  Surgeon: Tarry Kos, MD;  Location: [facility] OR;  Service: Orthopedics;  Laterality: Left;  3-C   Social History   Occupational History   Occupation: Retired  Tobacco Use   Smoking status: Former    Current packs/day: 0.00    Average packs/day: 1 pack/day for 20.0 years (20.0 ttl pk-yrs)    Types: Cigarettes    Start date: 1968    Quit date: 1988    Years since quitting: 37.2    Passive exposure: Never   Smokeless tobacco: Never  Vaping Use   Vaping status: Never Used  Substance and Sexual Activity   Alcohol use: Yes    Comment: occasional    Drug use: Never   Sexual activity: Not on file

## 2024-03-20 ENCOUNTER — Other Ambulatory Visit: Payer: Self-pay | Admitting: Internal Medicine

## 2024-04-03 ENCOUNTER — Encounter: Payer: Self-pay | Admitting: Physician Assistant

## 2024-04-03 ENCOUNTER — Other Ambulatory Visit: Payer: Self-pay | Admitting: Physician Assistant

## 2024-04-03 ENCOUNTER — Ambulatory Visit (INDEPENDENT_AMBULATORY_CARE_PROVIDER_SITE_OTHER): Admitting: Physician Assistant

## 2024-04-03 DIAGNOSIS — Z96642 Presence of left artificial hip joint: Secondary | ICD-10-CM

## 2024-04-03 MED ORDER — AMOXICILLIN 500 MG PO CAPS: ORAL_CAPSULE | ORAL | 2 refills | Status: AC

## 2024-04-03 NOTE — Progress Notes (Signed)
   Post-Op Visit Note   Patient: Valerie Reese           Date of Birth: September 10, 1946           MRN: [id number] Visit Date: 04/03/2024 PCP: Arlys Berke, MD   Assessment & Plan:  Chief Complaint:  Chief Complaint  Patient presents with   Left Hip - Follow-up    Left total hip arthroplasty 01/09/2024   Visit Diagnoses:  1. Status post total replacement of left hip     Plan: Patient is a very pleasant 78 year old female who comes in today 3 months status post left total hip replacement 01/09/2024.  She does note some soreness in the groin with walking but otherwise no complaints.  She is walking unassisted at this time.  Examination of the left hip reveals no pain with hip flexion or logroll.  She is neurovascularly intact distally.  At this point, she will continue to increase activity as tolerated.  Follow-up in 3 months for repeat evaluation and AP pelvis x-rays.  Dental prophylaxis reinforced.  Call with concerns or questions.  Follow-Up Instructions: Return in about 3 months (around 07/04/2024).   Orders:  No orders of the defined types were placed in this encounter.  No orders of the defined types were placed in this encounter.   Imaging:   PMFS History: Patient Active Problem List   Diagnosis Date Noted   Status post total replacement of left hip 01/09/2024   Primary osteoarthritis of left hip 01/07/2024   Essential hypertension 01/30/2023   Acquired hypothyroidism 01/30/2023   Hyperlipidemia LDL goal <70 01/30/2023   Chronic pain of left knee 01/30/2023   Multiple lung nodules 05/06/2021   Past Medical History:  Diagnosis Date   Allergic rhinitis    Hypertension    Hypothyroid    age 70   Hypothyroidism    Memory difficulty    pt reports she functions normally with some memory problems- one doctor stated that she may have a mild dementia    Family History  Problem Relation Age of Onset   Aneurysm Mother    Fibroids Father    Pulmonary disease Father     Hypertension Father    Hypertension Brother    Lung cancer Maternal Grandfather     Past Surgical History:  Procedure Laterality Date   CATARACT EXTRACTION Bilateral    ECTOPIC PREGNANCY SURGERY     TOTAL HIP ARTHROPLASTY Left 01/09/2024   Procedure: LEFT TOTAL HIP ARTHROPLASTY ANTERIOR APPROACH;  Surgeon: Wes Hamman, MD;  Location: [facility] OR;  Service: Orthopedics;  Laterality: Left;  3-C   Social History   Occupational History   Occupation: Retired  Tobacco Use   Smoking status: Former    Current packs/day: 0.00    Average packs/day: 1 pack/day for 20.0 years (20.0 ttl pk-yrs)    Types: Cigarettes    Start date: 1968    Quit date: 1988    Years since quitting: 37.4    Passive exposure: Never   Smokeless tobacco: Never  Vaping Use   Vaping status: Never Used  Substance and Sexual Activity   Alcohol use: Yes    Comment: occasional    Drug use: Never   Sexual activity: Not on file

## 2024-06-15 DIAGNOSIS — E7800 Pure hypercholesterolemia, unspecified: Secondary | ICD-10-CM | POA: Diagnosis not present

## 2024-06-15 DIAGNOSIS — R413 Other amnesia: Secondary | ICD-10-CM | POA: Diagnosis not present

## 2024-06-15 DIAGNOSIS — E039 Hypothyroidism, unspecified: Secondary | ICD-10-CM | POA: Diagnosis not present

## 2024-06-15 DIAGNOSIS — Z0000 Encounter for general adult medical examination without abnormal findings: Secondary | ICD-10-CM | POA: Diagnosis not present

## 2024-07-10 ENCOUNTER — Ambulatory Visit: Admitting: Physician Assistant

## 2024-07-10 ENCOUNTER — Encounter: Payer: Self-pay | Admitting: Physician Assistant

## 2024-07-10 ENCOUNTER — Other Ambulatory Visit (INDEPENDENT_AMBULATORY_CARE_PROVIDER_SITE_OTHER)

## 2024-07-10 DIAGNOSIS — Z96642 Presence of left artificial hip joint: Secondary | ICD-10-CM

## 2024-07-10 NOTE — Progress Notes (Signed)
   Post-Op Visit Note   Patient: Valerie Reese           Date of Birth: 03/28/1946           MRN: [id number] Visit Date: 07/10/2024 PCP: Delayne Artist PARAS, MD   Assessment & Plan:  Chief Complaint:  Chief Complaint  Patient presents with   Left Hip - Follow-up    Left total hip arthroplasty 01/09/2024   Visit Diagnoses:  1. Status post total replacement of left hip     Plan: Patient is a pleasant 78 year old female who comes in today 6 months status post left total hip replacement 01/09/2024.  She has been doing well.  She still notes some soreness to the groin as well as some paresthesias to the lateral hip but overall doing well.  Examination of her left hip: Painless logroll.  She has mild discomfort to the groin with hip flexion.  She is neurovascularly intact distally.  At this point, she will continue to advance with activity as tolerated.  Follow-up in 6 months for repeat evaluation and AP pelvis x-rays.  Call with concerns or questions.  Follow-Up Instructions: Return in about 6 months (around 01/10/2025).   Orders:  Orders Placed This Encounter  Procedures   XR Pelvis 1-2 Views   No orders of the defined types were placed in this encounter.   Imaging: XR Pelvis 1-2 Views Result Date: 07/10/2024 Well-seated prosthesis without complication   PMFS History: Patient Active Problem List   Diagnosis Date Noted   Status post total replacement of left hip 01/09/2024   Primary osteoarthritis of left hip 01/07/2024   Essential hypertension 01/30/2023   Acquired hypothyroidism 01/30/2023   Hyperlipidemia LDL goal <70 01/30/2023   Chronic pain of left knee 01/30/2023   Multiple lung nodules 05/06/2021   Past Medical History:  Diagnosis Date   Allergic rhinitis    Hypertension    Hypothyroid    age 33   Hypothyroidism    Memory difficulty    pt reports she functions normally with some memory problems- one doctor stated that she may have a mild dementia    Family  History  Problem Relation Age of Onset   Aneurysm Mother    Fibroids Father    Pulmonary disease Father    Hypertension Father    Hypertension Brother    Lung cancer Maternal Grandfather     Past Surgical History:  Procedure Laterality Date   CATARACT EXTRACTION Bilateral    ECTOPIC PREGNANCY SURGERY     TOTAL HIP ARTHROPLASTY Left 01/09/2024   Procedure: LEFT TOTAL HIP ARTHROPLASTY ANTERIOR APPROACH;  Surgeon: Jerri Kay HERO, MD;  Location: [facility] OR;  Service: Orthopedics;  Laterality: Left;  3-C   Social History   Occupational History   Occupation: Retired  Tobacco Use   Smoking status: Former    Current packs/day: 0.00    Average packs/day: 1 pack/day for 20.0 years (20.0 ttl pk-yrs)    Types: Cigarettes    Start date: 1968    Quit date: 1988    Years since quitting: 37.6    Passive exposure: Never   Smokeless tobacco: Never  Vaping Use   Vaping status: Never Used  Substance and Sexual Activity   Alcohol use: Yes    Comment: occasional    Drug use: Never   Sexual activity: Not on file

## 2024-07-12 ENCOUNTER — Ambulatory Visit
Admission: RE | Admit: 2024-07-12 | Discharge: 2024-07-12 | Disposition: A | Discharge status: Home / Self Care | Source: Ambulatory Visit | Attending: Family Medicine | Admitting: Family Medicine

## 2024-07-12 DIAGNOSIS — F03B0 Unspecified dementia, moderate, without behavioral disturbance, psychotic disturbance, mood disturbance, and anxiety: Secondary | ICD-10-CM

## 2024-09-12 DIAGNOSIS — M7121 Synovial cyst of popliteal space [Baker], right knee: Secondary | ICD-10-CM | POA: Diagnosis not present

## 2024-09-17 ENCOUNTER — Encounter: Payer: Self-pay | Admitting: Radiology

## 2024-11-27 ENCOUNTER — Ambulatory Visit: Payer: Self-pay | Admitting: Neurology

## 2024-11-27 ENCOUNTER — Encounter: Payer: Self-pay | Admitting: Neurology

## 2024-11-27 VITALS — BP 142/92 | HR 85 | Ht 61.0 in | Wt 127.0 lb

## 2024-11-27 DIAGNOSIS — F03911 Unspecified dementia, unspecified severity, with agitation: Secondary | ICD-10-CM

## 2024-11-27 DIAGNOSIS — R413 Other amnesia: Secondary | ICD-10-CM | POA: Diagnosis not present

## 2024-11-27 DIAGNOSIS — Z789 Other specified health status: Secondary | ICD-10-CM

## 2024-11-27 DIAGNOSIS — R4189 Other symptoms and signs involving cognitive functions and awareness: Secondary | ICD-10-CM

## 2024-11-27 MED ORDER — DONEPEZIL HCL 5 MG PO TABS: 5.0000 mg | ORAL_TABLET | Freq: Every day | ORAL | 3 refills | Status: AC

## 2024-11-27 NOTE — Progress Notes (Addendum)
 Subjective:    Patient ID: Valerie Reese is a 79 y.o. female.  HPI    True Mar, MD, PhD [facility] [address] [address] [place], [place] [address]  Dear Dr. Delayne,  I saw your patient, Valerie Reese, upon your kind request in my neurologic clinic today for evaluation of her memory loss.  The patient is accompanied by her brother Oneil [facility]) today.  As you know, 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  She reports that she has a good life and good activity level and she takes care of her dogs.  She does have a tendency to repeat herself.  Her brother has been her power of attorney for the past year.  He has been taking care of her finances and checks on her regularly, at least texts her every day and provides quite a bit of the history today.  Patient is visibly upset at times, she does voice frustration that she is here today and that he has taken over.  He lives about 5 minutes away he states, he sees her on a regular basis.  She lives alone in a [place]home in a neighborhood.  She has 3 dogs in the household.  She reports having had no major issues with her memory.  Her brother has noticed decline in her memory for the past at least 2 years.  She was on donepezil  but it is unclear why she is no longer on it.  She was at 1 point complaining of taking too many medications and it may be for that reason that she is no longer on the donepezil .  She also did not wish to follow-up through [facility] as I understand and switched her primary care back to [facility] medicine.  She is divorced and has no children.  She has 1 brother who is 7 years younger.  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  She denies any issues getting lost while driving.  She denies having forgotten anything on the stove.  Parents did not have any dementia, dad lived to be 11 and died of a ruptured abdominal aneurysm per Oneil.  Mom lived to be 73 and had pulmonary fibrosis.  She does not always hydrate well.  She is currently maintaining weight but has not been eating necessarily well either according to her brother.  She is estimated to drink about 2 cups of water per day.  She drinks alcohol in the form of beer, she reports that she drinks 1 beer daily.  She quit smoking over 40 years ago.  I reviewed your office note from 06/15/2024.  Blood work in March 2025 reportedly did not show any abnormality with her TSH or B12 at the time.  No recent blood test results were available in her paper chart and last blood work in her electronic chart was from February 2025.  She had a brain MRI without contrast on 07/12/2024 and I reviewed the results:  IMPRESSION: 1.  No evidence of an acute intracranial abnormality. 2. Mild chronic small vessel ischemic changes within the cerebral white matter. 3. Moderate generalized cerebral atrophy. 4. Paranasal sinus disease  as described.      In addition, I personally and independently reviewed images through the PACS system.  She was on donepezil  in 2024 but has not been on it recently.  Donepezil  5 mg strength once daily was prescribed previously by [employer].  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 a mild dementia    Her Past Surgical History Is Significant For: Past Surgical History:  Procedure Laterality Date    CATARACT EXTRACTION Bilateral    ECTOPIC PREGNANCY SURGERY     TOTAL HIP ARTHROPLASTY Left 01/09/2024   Procedure: LEFT TOTAL HIP ARTHROPLASTY ANTERIOR APPROACH;  Surgeon: Jerri Kay HERO, MD;  Location: [facility] OR;  Service: Orthopedics;  Laterality: Left;  3-C    Her Family History Is Significant For: Family History  Problem Relation Age of Onset   Aneurysm Mother    Fibroids Father    Pulmonary disease Father    Hypertension Father    Hypertension Brother    Lung cancer Maternal Grandfather    Alzheimer's disease Neg Hx    Dementia Neg Hx     Her Social History Is Significant For: Social History   Socioeconomic History   Marital status: Divorced    Spouse name: Not on file   Number of children: 0   Years of education: Not on file   Highest education level: Not on file  Occupational History   Occupation: Retired  Tobacco Use   Smoking status: Former    Current packs/day: 0.00    Average packs/day: 1 pack/day for 20.0 years (20.0 ttl pk-yrs)    Types: Cigarettes    Start date: 1968    Quit date: 1988    Years since quitting: 38.0    Passive exposure: Never   Smokeless tobacco: Never  Vaping Use   Vaping status: Never Used  Substance and Sexual Activity   Alcohol use: Yes    Alcohol/week: 7.0 standard drinks of alcohol    Types: 7 Cans of beer per week    Comment: occasional    Drug use: Never   Sexual activity: Not on file  Other Topics Concern   Not on file  Social History Narrative   Lives with her 3 dogs   Retired    Social Drivers of Health   Tobacco Use: Medium Risk (11/27/2024)   Patient History    Smoking Tobacco Use: Former    Smokeless Tobacco Use: Never    Passive Exposure: Never  Physicist, Medical Strain: Patient Declined (04/06/2023)   Overall Financial Resource Strain (CARDIA)    Difficulty of Paying Living Expenses: Patient declined  Food Insecurity: No Food Insecurity (01/09/2024)   Hunger Vital Sign    Worried About Running Out of Food in the  Last Year: Never true    Ran Out of Food in the Last Year: Never true  Transportation Needs: No Transportation Needs (01/09/2024)   PRAPARE - Administrator, Civil Service (Medical): No    Lack of Transportation (Non-Medical): No  Physical Activity: Sufficiently Active (04/06/2023)   Exercise Vital Sign    Days of Exercise per Week: 7 days    Minutes of Exercise per Session: 60 min  Stress:  No Stress Concern Present (04/06/2023)   [employer] of Occupational Health - Occupational Stress Questionnaire    Feeling of Stress : Not at all  Social Connections: Unknown (01/09/2024)   Social Connection and Isolation Panel    Frequency of Communication with Friends and Family: Once a week    Frequency of Social Gatherings with Friends and Family: Patient declined    Attends Religious Services: 1 to 4 times per year    Active Member of Clubs or Organizations: No    Attends Banker Meetings: 1 to 4 times per year    Marital Status: Divorced  Depression (PHQ2-9): Low Risk (08/08/2023)   Depression (PHQ2-9)    PHQ-2 Score: 0  Alcohol Screen: Low Risk (04/06/2023)   Alcohol Screen    Last Alcohol Screening Score (AUDIT): 3  Housing: Low Risk (01/09/2024)   Housing Stability Vital Sign    Unable to Pay for Housing in the Last Year: No    Number of Times Moved in the Last Year: 0    Homeless in the Last Year: No  Utilities: Not At Risk (01/09/2024)   AHC Utilities    Threatened with loss of utilities: No  Health Literacy: Not on file    Her Allergies Are:  Allergies[1]:   Her Current Medications Are:  Outpatient Encounter Medications as of 11/27/2024  Medication Sig   amoxicillin  (AMOXIL ) 500 MG capsule Take four pills one hour prior to dental work   atorvastatin  (LIPITOR) 10 MG tablet Take 1 tablet (10 mg total) by mouth daily.   Cholecalciferol (VITAMIN D3 ULTRA STRENGTH) 125 MCG (5000 UT) capsule Take 5,000 Units by mouth daily.   levothyroxine  (SYNTHROID ) 112  MCG tablet TAKE 1 TABLET BY MOUTH ONCE DAILY BEFORE BREAKFAST   losartan  (COZAAR ) 25 MG tablet Take 1 tablet by mouth once daily (Patient taking differently: Take 100 mg by mouth daily.)   ondansetron  (ZOFRAN ) 4 MG tablet Take 1 tablet (4 mg total) by mouth every 8 (eight) hours as needed for nausea or vomiting.   traMADol  (ULTRAM ) 50 MG tablet Take 1-2 tablets (50-100 mg total) by mouth every 6 (six) hours as needed.   Coenzyme Q10 (COQ10) 100 MG CAPS Take 100 mg by mouth daily. (Patient not taking: Reported on 11/27/2024)   donepezil  (ARICEPT ) 5 MG tablet TAKE 1 TABLET BY MOUTH AT BEDTIME   No facility-administered encounter medications on file as of 11/27/2024.  :   Review of Systems:  Out of a complete 14 point review of systems, all are reviewed and negative with the exception of these symptoms as listed below:  Review of Systems  Objective:  Neurological Exam  Physical Exam Physical Examination:   Vitals:   11/27/24 1439  BP: (!) 142/92  Pulse: 85    General Examination: The patient is a 79 year old female in no acute distress.  She is visibly upset and frustrated at times during the visit.   HEENT: Normocephalic, atraumatic, pupils are equal, round and reactive to light, extraocular tracking is good without limitation to gaze excursion or nystagmus noted. no photophobia. No Corrective eye glasses in place. Hearing is grossly intact.  Face is symmetric with normal facial animation. Speech is clear without dysarthria. There is no hypophonia. There is no lip, neck/head, jaw or voice tremor. Neck is supple with full range of passive and active motion. There are no carotid bruits on auscultation.  Airway/Oropharynx exam reveals: moderate mouth dryness, adequate dental hygiene. Tongue protrudes centrally and palate elevates symmetrically.  Chest: Clear to auscultation without wheezing, rhonchi or crackles noted.  Heart: S1+S2+0, regular and normal without murmurs, rubs or gallops  noted.   Abdomen: Soft, non-tender and non-distended.  Extremities: There is no pitting edema in the distal lower extremities bilaterally.   Skin: Warm and dry without trophic changes noted.   Musculoskeletal: exam reveals no obvious joint deformities.   Neurologically:  Mental status: The patient is awake, pays attention, history is supplemented by her brother.  Mood is constricted and affect is constricted.      11/27/2024    2:40 PM 04/08/2023    3:07 PM  MMSE - Mini Mental State Exam  Orientation to time 1 5  Orientation to Place 4 5  Registration 3 3  Attention/ Calculation 5 5  Recall 2 3  Language- name 2 objects 2 2  Language- repeat 1 1  Language- follow 3 step command 3 3  Language- read & follow direction 1 1  Write a sentence 1 1  Copy design 0 1  Total score 23 30   Cranial nerves II - XII are as described above under HEENT exam.  Motor exam: Normal bulk, strength and tone is noted. There is no obvious action or resting tremor.  Fine motor skills and coordination: Intact grossly.  Cerebellar testing: No dysmetria or intention tremor. There is no truncal or gait ataxia.  Sensory exam: intact to light touch in the upper and lower extremities.  Gait, station and balance: She stands easily. No veering to one side is noted. No leaning to one side is noted. Posture is age-appropriate and stance is narrow based. Gait shows normal stride length and normal pace. No problems turning are noted.   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  She had an MMSE of 30 back in May 2024 and she had been on donepezil  in 2024 but it has since then been discontinued.   She did not have any overt side effects as far as her brother knows.  MMSE it is mildly abnormal today.  I had a long discussion with the patient and her brother today.  We talked about different causes of memory loss including Alzheimer's dementia.  She is quite reluctant to proceed with additional testing and consider lifestyle modification but we talked about the importance of healthy nutrition, good hydration with water, limiting alcohol, staying active physically and driving safely.  This was an extended visit of over 60 minutes with copious record review involved, memory testing and interpretation, considerable counseling and coordination of care.   Her brother has power of attorney and would like to proceed with additional evaluation and treatment.  She was noted to be somewhat indifferent about restarting donepezil  but he would like to get her restarted on it.  She is advised to avoid drinking beer every day.  She is encouraged to increase her water intake.  We talked about driving safety as well.  She is encouraged to pursue evaluation with neuropsychology, he would like to get her referral started. Below is a summary of my recommendations and our discussion points from today's visit, based on chart review, history and examination. They were given these instructions verbally during the visit in detail  and also in writing in the MyChart after visit summary (AVS), which they can access electronically. <<  Blood work (which we will do today).  I do not believe you need another scan of your brain at this time.  You recently had a brain MRI less than 6 months ago.   I would like to consider a formal cognitive test called neuropsychological evaluation which is done by a licensed neuropsychologist. We will make a referral in that regard.  I would like to restart you on donepezil  5 mg strength once daily. Please increase your water intake to about 6 to 8 cups of water per day and avoid drinking beer every  day as it may not mix well with your memory medication.  Please work on alcohol cessation.   I am not sure if you are completely safe to drive.  I would like for your family to monitor your driving skills and sit with you when you go to your grocery store for example.   We will plan a follow-up in this clinic in about 4 months.  >>    Thank you very much for allowing me to participate in the care of this nice patient. If I can be of any further assistance to you please do not hesitate to call me at [phone].  Sincerely,   True Mar, MD, PhD     [1] No Known Allergies

## 2024-11-27 NOTE — Patient Instructions (Addendum)
 It was nice to meet you both today.  You have complaints of memory loss: memory loss or changes in cognitive function can have many reasons and does not always mean you have dementia.  There are several conditions and situations that can contribute to subjective or objective memory loss.  These factors include: depression, stress, sleep deprivation or poor sleep from insomnia or sleep apnea, dehydration, fluctuation in blood sugar values, thyroid  or electrolyte dysfunction, medication effects from sedating medications or narcotic pain medication for example and certain vitamin deficiencies such as vitamin B12 deficiency, and anemia. Dementia can be caused by stroke, brain atherosclerosis or brain vascular disease due to vascular risk factors (smoking, high blood pressure, high cholesterol, obesity and uncontrolled diabetes), certain degenerative brain disorders (including Parkinson's disease and Multiple sclerosis) and by Alzheimer's disease or other, more rare and sometimes hereditary causes.   Here is what I would recommend:   Blood work (which we will do today).  I do not believe you need another scan of your brain at this time.  You recently had a brain MRI less than 6 months ago.   I would like to consider a formal cognitive test called neuropsychological evaluation which is done by a licensed neuropsychologist. We will make a referral in that regard.  I would like to restart you on donepezil  5 mg strength once daily. Please increase your water intake to about 6 to 8 cups of water per day and avoid drinking beer every day as it may not mix well with your memory medication.  Please work on alcohol cessation.   I am not sure if you are completely safe to drive.  I would like for your family to monitor your driving skills and sit with you when you go to your grocery store for example.   We will plan a follow-up in this clinic in about 4 months.

## 2024-12-07 ENCOUNTER — Encounter: Payer: Self-pay | Admitting: Psychology

## 2024-12-12 LAB — ATN PROFILE
A -- Beta-amyloid 42/40 Ratio: 0.102 — ABNORMAL LOW
Beta-amyloid 40: 221.68 pg/mL
Beta-amyloid 42: 22.53 pg/mL
N -- NfL, Plasma: 3.53 pg/mL (ref 0.00–6.04)
T -- p-tau181: 1.68 pg/mL — ABNORMAL HIGH (ref 0.00–0.97)

## 2024-12-12 LAB — B12 AND FOLATE PANEL
Folate: >20 ng/mL
Vitamin B-12: 539 pg/mL (ref 232–1245)

## 2024-12-12 LAB — APOE ALZHEIMER'S DISEASE RISK

## 2024-12-12 LAB — TSH: TSH: 0.614 u[IU]/mL (ref 0.450–4.500)

## 2024-12-13 ENCOUNTER — Ambulatory Visit: Payer: Self-pay | Admitting: Neurology

## 2025-03-18 ENCOUNTER — Encounter: Admitting: Psychology

## 2025-04-04 ENCOUNTER — Ambulatory Visit: Admitting: Neurology
